# Patient Record
Sex: Female | Born: 1960 | Race: White | Hispanic: No | Smoking: Never smoker
Health system: Southern US, Community
[De-identification: ages and names within clinical notes are randomized; demographics above are authoritative.]

## PROBLEM LIST (undated history)

## (undated) DIAGNOSIS — R519 Headache, unspecified: Secondary | ICD-10-CM

## (undated) DIAGNOSIS — F419 Anxiety disorder, unspecified: Secondary | ICD-10-CM

## (undated) DIAGNOSIS — R51 Headache: Secondary | ICD-10-CM

## (undated) HISTORY — PX: WISDOM TOOTH EXTRACTION: SHX21

---

## 2004-02-24 HISTORY — PX: BREAST BIOPSY: SHX20

## 2009-02-23 HISTORY — PX: WOUND DEBRIDEMENT: SHX247

## 2011-07-30 ENCOUNTER — Other Ambulatory Visit: Payer: Self-pay | Admitting: Obstetrics and Gynecology

## 2011-07-30 DIAGNOSIS — Z1231 Encounter for screening mammogram for malignant neoplasm of breast: Secondary | ICD-10-CM

## 2011-08-10 ENCOUNTER — Ambulatory Visit
Admission: RE | Admit: 2011-08-10 | Discharge: 2011-08-10 | Disposition: A | Discharge status: Home / Self Care | Payer: 59 | Source: Ambulatory Visit | Attending: Obstetrics and Gynecology | Admitting: Obstetrics and Gynecology

## 2011-08-10 DIAGNOSIS — Z1231 Encounter for screening mammogram for malignant neoplasm of breast: Secondary | ICD-10-CM

## 2014-05-16 ENCOUNTER — Other Ambulatory Visit (HOSPITAL_COMMUNITY)
Admission: RE | Admit: 2014-05-16 | Discharge: 2014-05-16 | Disposition: A | Discharge status: Home / Self Care | Payer: 59 | Source: Ambulatory Visit | Attending: Family Medicine | Admitting: Family Medicine

## 2014-05-16 ENCOUNTER — Other Ambulatory Visit: Payer: Self-pay | Admitting: Family Medicine

## 2014-05-16 DIAGNOSIS — Z124 Encounter for screening for malignant neoplasm of cervix: Secondary | ICD-10-CM | POA: Insufficient documentation

## 2014-05-16 DIAGNOSIS — Z1151 Encounter for screening for human papillomavirus (HPV): Secondary | ICD-10-CM | POA: Diagnosis present

## 2014-05-16 DIAGNOSIS — Z113 Encounter for screening for infections with a predominantly sexual mode of transmission: Secondary | ICD-10-CM | POA: Insufficient documentation

## 2014-05-17 LAB — CYTOLOGY - PAP

## 2014-05-28 ENCOUNTER — Other Ambulatory Visit: Payer: Self-pay

## 2014-05-28 DIAGNOSIS — Z1231 Encounter for screening mammogram for malignant neoplasm of breast: Secondary | ICD-10-CM

## 2014-06-06 ENCOUNTER — Ambulatory Visit: Payer: 59

## 2014-06-07 ENCOUNTER — Ambulatory Visit
Admission: RE | Admit: 2014-06-07 | Discharge: 2014-06-07 | Disposition: A | Discharge status: Home / Self Care | Payer: 59 | Source: Ambulatory Visit

## 2014-06-07 DIAGNOSIS — Z1231 Encounter for screening mammogram for malignant neoplasm of breast: Secondary | ICD-10-CM

## 2014-06-11 ENCOUNTER — Ambulatory Visit: Payer: 59 | Attending: Gynecologic Oncology | Admitting: Gynecologic Oncology

## 2014-06-11 ENCOUNTER — Encounter: Payer: Self-pay | Admitting: Gynecologic Oncology

## 2014-06-11 VITALS — BP 153/89 | HR 97 | Temp 98.8°F | Resp 18 | Ht 63.0 in | Wt 187.9 lb

## 2014-06-11 DIAGNOSIS — C541 Malignant neoplasm of endometrium: Secondary | ICD-10-CM | POA: Insufficient documentation

## 2014-06-11 DIAGNOSIS — C801 Malignant (primary) neoplasm, unspecified: Secondary | ICD-10-CM

## 2014-06-11 DIAGNOSIS — N888 Other specified noninflammatory disorders of cervix uteri: Secondary | ICD-10-CM

## 2014-06-11 NOTE — Patient Instructions (Signed)
Dr. Denman George will call you with the results of your biopsies.                Preparing for your Surgery  Plan for surgery on June 7 with Dr. Denman George.  Pre-operative Testing -You will receive a phone call from presurgical testing at [facility] to arrange for a pre-operative testing appointment before your surgery.  This appointment normally occurs one to two weeks before your scheduled surgery.   -Bring your insurance card, copy of an advanced directive if applicable, medication list  -At that visit, you will be asked to sign a consent for a possible blood transfusion in case a transfusion becomes necessary during surgery.  The need for a blood transfusion is rare but having consent is a necessary part of your care.     -You should not be taking blood thinners or aspirin at least ten days prior to surgery unless instructed by your surgeon.  Day Before Surgery at [place] will be asked to take in only clear liquids the day before surgery.  Examples of clear liquids include broths, jello, and clear juices.  You will be advised to have nothing to eat or drink after midnight the evening before.    Your role in recovery Your role is to become active as soon as directed by your doctor, while still giving yourself time to heal.  Rest when you feel tired. You will be asked to do the following in order to speed your recovery:  - Cough and breathe deeply. This helps toclear and expand your lungs and can prevent pneumonia. You may be given a spirometer to practice deep breathing. A staff member will show you how to use the spirometer. - Do mild physical activity. Walking or moving your legs help your circulation and body functions return to normal. A staff member will help you when you try to walk and will provide you with simple exercises. Do not try to get up or walk alone the first time. - Actively manage your pain. Managing your pain lets you move in comfort. We will ask you to rate your pain on  a scale of zero to 10. It is your responsibility to tell your doctor or nurse where and how much you hurt so your pain can be treated.  Special Considerations -If you are diabetic, you may be placed on insulin after surgery to have closer control over your blood sugars to promote healing and recovery.  This does not mean that you will be discharged on insulin.  If applicable, your oral antidiabetics will be resumed when you are tolerating a solid diet.  -Your final pathology results from surgery should be available by the Friday after surgery and the results will be relayed to you when available.  Blood Transfusion Information WHAT IS A BLOOD TRANSFUSION? A transfusion is the replacement of blood or some of its parts. Blood is made up of multiple cells which provide different functions.  Red blood cells carry oxygen and are used for blood loss replacement.  White blood cells fight against infection.  Platelets control bleeding.  Plasma helps clot blood.  Other blood products are available for specialized needs, such as hemophilia or other clotting disorders. BEFORE THE TRANSFUSION  Who gives blood for transfusions?   You may be able to donate blood to be used at a later date on yourself (autologous donation).  Relatives can be asked to donate blood. This is generally not any safer than if you  have received blood from a stranger. The same precautions are taken to ensure safety when a relative's blood is donated.  Healthy volunteers who are fully evaluated to make sure their blood is safe. This is blood bank blood. Transfusion therapy is the safest it has ever been in the practice of medicine. Before blood is taken from a donor, a complete history is taken to make sure that person has no history of diseases nor engages in risky social behavior (examples are intravenous drug use or sexual activity with multiple partners). The donor's travel history is screened to minimize risk of  transmitting infections, such as malaria. The donated blood is tested for signs of infectious diseases, such as HIV and hepatitis. The blood is then tested to be sure it is compatible with you in order to minimize the chance of a transfusion reaction. If you or a relative donates blood, this is often done in anticipation of surgery and is not appropriate for emergency situations. It takes many days to process the donated blood. RISKS AND COMPLICATIONS Although transfusion therapy is very safe and saves many lives, the main dangers of transfusion include:   Getting an infectious disease.  Developing a transfusion reaction. This is an allergic reaction to something in the blood you were given. Every precaution is taken to prevent this. The decision to have a blood transfusion has been considered carefully by your caregiver before blood is given. Blood is not given unless the benefits outweigh the risks.

## 2014-06-11 NOTE — Progress Notes (Signed)
Consult Note: Gyn-Onc  Consult was requested by Dr. Landry Mellow for the evaluation of Ana Luna 54 y.o. female with an abnormal Pap smear showing adenocarcinoma and adenocarcinoma on endometrial biopsy.  CC:  Chief Complaint  Patient presents with  . adenocarcinoma    endocervical vs endometrial    Assessment/Plan:  Ana Luna  is a 54 y.o.  year old with likely clinical stage I endometrial cancer. There is a diagnostic question regarding whether this is a primary cervical adenocarcinoma or endometrial cancer. The patient has no history of abnormal bleeding or postmenopausal bleeding. However she also has no history of abnormal Pap smears or HPV positivity. Her HPV high risk was negative on this recent Pap.  I discussed the importance of determining (to the best of our abilities) preoperatively whether or not this lesion is a cervical or endometrial source because it would dictate a different surgical approach and to minimize the need for postoperative radiation for inadequate margins around a cervical cancer.  Today's colposcopy did not reveal obvious or apparent cervical cancer. However an endocervical lesion still may be the source. Therefore I have ordered an MRI of the pelvis to better evaluate the lower uterine segment and endocervix. If a mass is present in this region, she may benefit from a radical hysterectomy rather than a type I extrafascial hysterectomy in order to ensure negative margins and to minimize the necessity of postoperative radiation.  If today's colposcopic biopsies are negative for malignancy, given that she has a Pap smear favoring endometrial origin adenocarcinoma, and an endometrial biopsy suspicious for adenocarcinoma I believe that proceeding with a hysterectomy and staging for presumed endometrial cancer is appropriate unless an obvious cervical mass is identified on the MRI.   I discussed surgical management of endometrial cancer including my recommendation for  a robotic-assisted total hysterectomy BSO and lymph node biopsy. I discussed that risks are associated with surgery including bleeding, infection to pelvic structures of the skin, damage to internal organs, reoperation, lymphedema, nerve injury, injuries associated with positioning, reoperation, VTE. We discussed anticipated postoperative recovery and hospital stay.  If today's biopsies reveal a dysplastic process or is concerning for microinvasive cervical cancer I am recommending proceeding first with a conization of the cervix to better delineate the size in nature of the cervical lesion. Certainly endometrioid lesions can occur in primary cervical cancer and her symptoms of post-coital bleeding and discharge are more concerning for a primary cervical process.  HPI: Ana Luna is a very pleasant and very healthy 54 year old G3 P2 012 who is seen in consultation at the request of Dr. 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 Ana Luna has no coexisting medical complaints and no significant past family history for cancer. She was undergoing routine evaluation at [facility] with her Pap smear in March, 2016. Of note the patient has been compliant with our Pap smear surveillance in previous years. Her pap from 05/16/2014 showed adenocarcinoma and when reviewed by Dr. Lyndon Code from pathology was felt to favor an endometrioid origin. High-risk HPV was not detected in the sample. The patient was then seen by OB/GYN Dr. Christophe Louis who performed endometrial sampling with a pippelle in the office. This was performed on 05/29/2014. This biopsy was suspicious for adenocarcinoma with fragments of in active endometrium and microscopic detached atypical glandular fragments which is suspicious for adenocarcinoma.  The patient reports that she last went through menopause approximately 1 year ago. She's had no vaginal bleeding since that time. She had  does however reports some postcoital pink  spotting. She does also report some increased watery discharge. Both of these symptoms are somewhat concerning for potentially occult cervical cancer. She denies lower stream he edema, back pain, or pelvic pain.   Interval History: She has no vaginal bleeding  Current Meds:  Outpatient Encounter Prescriptions as of 06/11/2014  Medication Sig  . acyclovir ointment (ZOVIRAX) 5 % Apply thin layer to affected area  . pramoxine-hydrocortisone (PROCTOCREAM-HC) 1-1 % rectal cream Place rectally.  . valACYclovir (VALTREX) 500 MG tablet Take 500 mg by mouth.    Allergy: Not on File  Social Hx:   History   Social History  . Marital Status: Divorced    Spouse Name: N/A  . Number of Children: N/A  . Years of Education: N/A   Occupational History  . Not on file.   Social History Main Topics  . Smoking status: Never Smoker   . Smokeless tobacco: Not on file  . Alcohol Use: Yes     Comment: social, occasional   . Drug Use: No  . Sexual Activity: Not on file   Other Topics Concern  . Not on file   Social History Narrative  . No narrative on file    Past Surgical Hx:  Past Surgical History  Procedure Laterality Date  . Wound debridement Bilateral 2011    buttock wound cleaning from spider bite  . Breast biopsy Left 2006    left breast biopsy; benign finding    Past Medical Hx: History reviewed. No pertinent past medical history.  Past Gynecological History:  No abn paps, SVD x 2  No LMP recorded.  Family Hx:  Family History  Problem Relation Age of Onset  . Diabetes Mother   . Diabetes Father   . Heart disease Father     Review of Systems:  Constitutional  Feels well,    ENT Normal appearing ears and nares bilaterally Skin/Breast  No rash, sores, jaundice, itching, dryness Cardiovascular  No chest pain, shortness of breath, or edema  Pulmonary  No cough or wheeze.  Gastro Intestinal  No nausea, vomitting, or diarrhoea. No bright red blood per rectum, no  abdominal pain, change in bowel movement, or constipation.  Genito Urinary  No frequency, urgency, dysuria,  Musculo Skeletal  No myalgia, arthralgia, joint swelling or pain  Neurologic  No weakness, numbness, change in gait,  Psychology  No depression, anxiety, insomnia.   Vitals:  Blood pressure 153/89, pulse 97, temperature 98.8 F (37.1 C), temperature source Oral, resp. rate 18, height 5\' 3"  (1.6 m), weight 187 lb 14.4 oz (85.231 kg).  Physical Exam: WD in NAD Neck  Supple NROM, without any enlargements.  Lymph Node Survey No cervical supraclavicular or inguinal adenopathy Cardiovascular  Pulse normal rate, regularity and rhythm. S1 and S2 normal.  Lungs  Clear to auscultation bilateraly, without wheezes/crackles/rhonchi. Good air movement.  Skin  No rash/lesions/breakdown  Psychiatry  Alert and oriented to person, place, and time  Abdomen  Normoactive bowel sounds, abdomen soft, non-tender and overweight without evidence of hernia.  Back No CVA tenderness Genito Urinary  Vulva/vagina: Normal external female genitalia.   No lesions. No discharge or bleeding.  Bladder/urethra:  No lesions or masses, well supported bladder  Vagina: normal, no lesiosn  Cervix: grossly normal appearing, no lesions. Firm anteriorally - likely submucosal nabothian cyst.   Uterus:  Small, mobile, no parametrial involvement or nodularity.  Adnexa: no palpable masses. Rectal  Good tone, no masses no cul  de sac nodularity.  Extremities  No bilateral cyanosis, clubbing or edema.  PROCEDURE: COLPOSCOPY Speculum was placed in the vagina and the cervix was visualized circumferentially. It was doused with 4% acetic acid. The colposcope was used to magnify field. No acetowhite lesions or abnormal last culture was identified. The transformation what his own was identified at the cervical is in its entirety. It was grossly normal. There is prominence of the anterior lip of the cervix, which is likely  from an underlying nabothian cysts however given this abnormality and biopsy was taken from the cervix at 12:00 at the mid ectocervix. An additional endocervical curetting was performed and a biopsy from 12:00 at the transformation zone. Silver nitrate was used for hemostasis.   Donaciano Eva, MD   06/11/2014, 4:07 PM

## 2014-06-18 ENCOUNTER — Ambulatory Visit (HOSPITAL_COMMUNITY)
Admission: RE | Admit: 2014-06-18 | Discharge: 2014-06-18 | Disposition: A | Discharge status: Home / Self Care | Payer: 59 | Source: Ambulatory Visit | Attending: Gynecologic Oncology | Admitting: Gynecologic Oncology

## 2014-06-18 ENCOUNTER — Ambulatory Visit (HOSPITAL_COMMUNITY): Admission: RE | Admit: 2014-06-18 | Payer: 59 | Source: Ambulatory Visit

## 2014-06-18 DIAGNOSIS — C801 Malignant (primary) neoplasm, unspecified: Secondary | ICD-10-CM | POA: Insufficient documentation

## 2014-06-18 MED ORDER — GADOBENATE DIMEGLUMINE 529 MG/ML IV SOLN
20.0000 mL | Freq: Once | INTRAVENOUS | Status: AC | PRN
  Administered 2014-06-18: 17 mL via INTRAVENOUS

## 2014-06-19 ENCOUNTER — Telehealth: Payer: Self-pay | Admitting: Gynecologic Oncology

## 2014-06-19 NOTE — Telephone Encounter (Signed)
Informed patient of results of negative cervical biopsies and MRI showing no lower uterine segment or endocervical mass. This is likely a primary endometrial cancer. Will proceed with robotic hysterectomy, bso, lymphadenectomy.  Donaciano Eva, MD

## 2014-07-25 NOTE — Patient Instructions (Addendum)
Claire Dolores  07/25/2014   Your procedure is scheduled on:   07/31/2014    Report to [facility] Main  Entrance and follow signs to               [place] at     [place] AM.  Call this number if you have problems the morning of surgery [phone]   Remember: ONLY 1 PERSON MAY GO WITH YOU TO SHORT STAY TO GET  READY MORNING OF [place].  Clear liquid diet beginning on Monday, 07/30/2014 am.  Then nothing after midnite nite before surgery.     Take these medicines the morning of surgery with A SIP OF WATER: None                               You may not have any metal on your body including hair pins and              piercings  Do not wear jewelry, make-up, lotions, powders or perfumes, deodorant             Do not wear nail polish.  Do not shave  48 hours prior to surgery.               Do not bring valuables to the hospital. [place].  Contacts, dentures or bridgework may not be worn into surgery.  Leave suitcase in the car. After surgery it may be brought to your room.         Special Instructions: coughing and deep breathing exercises, leg exercises               Please read over the following fact sheets you were given: _____________________________________________________________________             [facility] - Preparing for Surgery Before surgery, you can play an important role.  Because skin is not sterile, your skin needs to be as free of germs as possible.  You can reduce the number of germs on your skin by washing with CHG (chlorahexidine gluconate) soap before surgery.  CHG is an antiseptic cleaner which kills germs and bonds with the skin to continue killing germs even after washing. Please DO NOT use if you have an allergy to CHG or antibacterial soaps.  If your skin becomes reddened/irritated stop using the CHG and inform your nurse when you arrive at Short Stay. Do not shave (including  legs and underarms) for at least 48 hours prior to the first CHG shower.  You may shave your face/neck. Please follow these instructions carefully:  1.  Shower with CHG Soap the night before surgery and the  morning of Surgery.  2.  If you choose to wash your hair, wash your hair first as usual with your  normal  shampoo.  3.  After you shampoo, rinse your hair and body thoroughly to remove the  shampoo.                           4.  Use CHG as you would any other liquid soap.  You can apply chg directly  to the skin and wash  Gently with a scrungie or clean washcloth.  5.  Apply the CHG Soap to your body ONLY FROM THE NECK DOWN.   Do not use on face/ open                           Wound or open sores. Avoid contact with eyes, ears mouth and genitals (private parts).                       Wash face,  Genitals (private parts) with your normal soap.             6.  Wash thoroughly, paying special attention to the area where your surgery  will be performed.  7.  Thoroughly rinse your body with warm water from the neck down.  8.  DO NOT shower/wash with your normal soap after using and rinsing off  the CHG Soap.                9.  Pat yourself dry with a clean towel.            10.  Wear clean pajamas.            11.  Place clean sheets on your bed the night of your first shower and do not  sleep with pets. Day of Surgery : Do not apply any lotions/deodorants the morning of surgery.  Please wear clean clothes to the hospital/surgery center.  FAILURE TO FOLLOW THESE INSTRUCTIONS MAY RESULT IN THE CANCELLATION OF YOUR SURGERY PATIENT SIGNATURE_________________________________  NURSE SIGNATURE__________________________________  ________________________________________________________________________    CLEAR LIQUID DIET   Foods Allowed                                                                     Foods Excluded  Coffee and tea, regular and decaf                              liquids that you cannot  Plain Jell-O in any flavor                                             see through such as: Fruit ices (not with fruit pulp)                                     milk, soups, orange juice  Iced Popsicles                                    All solid food Carbonated beverages, regular and diet                                    Cranberry, grape and apple juices Sports drinks like Gatorade Lightly seasoned clear broth or consume(fat free) Sugar, honey syrup  Sample Menu Breakfast                                Lunch                                     Supper Cranberry juice                    Beef broth                            Chicken broth Jell-O                                     Grape juice                           Apple juice Coffee or tea                        Jell-O                                      Popsicle                                                Coffee or tea                        Coffee or tea  _____________________________________________________________________   WHAT IS A BLOOD TRANSFUSION? Blood Transfusion Information  A transfusion is the replacement of blood or some of its parts. Blood is made up of multiple cells which provide different functions.  Red blood cells carry oxygen and are used for blood loss replacement.  White blood cells fight against infection.  Platelets control bleeding.  Plasma helps clot blood.  Other blood products are available for specialized needs, such as hemophilia or other clotting disorders. BEFORE THE TRANSFUSION  Who gives blood for transfusions?   Healthy volunteers who are fully evaluated to make sure their blood is safe. This is blood bank blood. Transfusion therapy is the safest it has ever been in the practice of medicine. Before blood is taken from a donor, a complete history is taken to make sure that person has no history of diseases nor engages in risky social behavior (examples  are intravenous drug use or sexual activity with multiple partners). The donor's travel history is screened to minimize risk of transmitting infections, such as malaria. The donated blood is tested for signs of infectious diseases, such as HIV and hepatitis. The blood is then tested to be sure it is compatible with you in order to minimize the chance of a transfusion reaction. If you or a relative donates blood, this is often done in anticipation of surgery and is not appropriate for emergency situations. It takes many days to process the donated blood. RISKS AND COMPLICATIONS Although transfusion therapy is very safe and saves many lives, the main dangers of transfusion include:  1. Getting an infectious disease. 2. Developing a transfusion reaction. This is an allergic reaction to something in the blood you were given. Every precaution is taken to prevent this. The decision to have a blood transfusion has been considered carefully by your caregiver before blood is given. Blood is not given unless the benefits outweigh the risks. AFTER THE TRANSFUSION  Right after receiving a blood transfusion, you will usually feel much better and more energetic. This is especially true if your red blood cells have gotten low (anemic). The transfusion raises the level of the red blood cells which carry oxygen, and this usually causes an energy increase.  The nurse administering the transfusion will monitor you carefully for complications. HOME CARE INSTRUCTIONS  No special instructions are needed after a transfusion. You may find your energy is better. Speak with your caregiver about any limitations on activity for underlying diseases you may have. SEEK MEDICAL CARE IF:   Your condition is not improving after your transfusion.  You develop redness or irritation at the intravenous (IV) site. SEEK IMMEDIATE MEDICAL CARE IF:  Any of the following symptoms occur over the next 12 hours:  Shaking chills.  You have  a temperature by mouth above 102 F (38.9 C), not controlled by medicine.  Chest, back, or muscle pain.  People around you feel you are not acting correctly or are confused.  Shortness of breath or difficulty breathing.  Dizziness and fainting.  You get a rash or develop hives.  You have a decrease in urine output.  Your urine turns a dark color or changes to pink, red, or brown. Any of the following symptoms occur over the next 10 days:  You have a temperature by mouth above 102 F (38.9 C), not controlled by medicine.  Shortness of breath.  Weakness after normal activity.  The white part of the eye turns yellow (jaundice).  You have a decrease in the amount of urine or are urinating less often.  Your urine turns a dark color or changes to pink, red, or brown. Document Released: 02/07/2000 Document Revised: 05/04/2011 Document Reviewed: 09/26/2007 ExitCare Patient Information 2014 [place].  _______________________________________________________________________  Incentive Spirometer  An incentive spirometer is a tool that can help keep your lungs clear and active. This tool measures how well you are filling your lungs with each breath. Taking long deep breaths may help reverse or decrease the chance of developing breathing (pulmonary) problems (especially infection) following:  A long period of time when you are unable to move or be active. BEFORE THE PROCEDURE   If the spirometer includes an indicator to show your best effort, your nurse or respiratory therapist will set it to a desired goal.  If possible, sit up straight or lean slightly forward. Try not to slouch.  Hold the incentive spirometer in an upright position. INSTRUCTIONS FOR USE  3. Sit on the edge of your bed if possible, or sit up as far as you can in bed or on a chair. 4. Hold the incentive spirometer in an upright position. 5. Breathe out normally. 6. Place the mouthpiece in your mouth and  seal your lips tightly around it. 7. Breathe in slowly and as deeply as possible, raising the piston or the ball toward the top of the column. 8. Hold your breath for 3-5 seconds or for as long as possible. Allow the piston or ball to fall to the bottom of the column. 9. Remove the mouthpiece from your mouth and breathe out  normally. 10. Rest for a few seconds and repeat Steps 1 through 7 at least 10 times every 1-2 hours when you are awake. Take your time and take a few normal breaths between deep breaths. 11. The spirometer may include an indicator to show your best effort. Use the indicator as a goal to work toward during each repetition. 12. After each set of 10 deep breaths, practice coughing to be sure your lungs are clear. If you have an incision (the cut made at the time of surgery), support your incision when coughing by placing a pillow or rolled up towels firmly against it. Once you are able to get out of bed, walk around indoors and cough well. You may stop using the incentive spirometer when instructed by your caregiver.  RISKS AND COMPLICATIONS  Take your time so you do not get dizzy or light-headed.  If you are in pain, you may need to take or ask for pain medication before doing incentive spirometry. It is harder to take a deep breath if you are having pain. AFTER USE  Rest and breathe slowly and easily.  It can be helpful to keep track of a log of your progress. Your caregiver can provide you with a simple table to help with this. If you are using the spirometer at home, follow these instructions: [place] IF:   You are having difficultly using the spirometer.  You have trouble using the spirometer as often as instructed.  Your pain medication is not giving enough relief while using the spirometer.  You develop fever of 100.5 F (38.1 C) or higher. SEEK IMMEDIATE MEDICAL CARE IF:   You cough up bloody sputum that had not been present before.  You develop  fever of 102 F (38.9 C) or greater.  You develop worsening pain at or near the incision site. MAKE SURE YOU:   Understand these instructions.  Will watch your condition.  Will get help right away if you are not doing well or get worse. Document Released: 06/22/2006 Document Revised: 05/04/2011 Document Reviewed: 08/23/2006 [facility] Patient Information 2014 [place], [place].   ________________________________________________________________________

## 2014-07-27 ENCOUNTER — Encounter (HOSPITAL_COMMUNITY): Payer: Self-pay

## 2014-07-27 ENCOUNTER — Ambulatory Visit (HOSPITAL_COMMUNITY)
Admission: RE | Admit: 2014-07-27 | Discharge: 2014-07-27 | Disposition: A | Discharge status: Home / Self Care | Payer: 59 | Source: Ambulatory Visit | Attending: Gynecologic Oncology | Admitting: Gynecologic Oncology

## 2014-07-27 ENCOUNTER — Encounter (HOSPITAL_COMMUNITY)
Admission: RE | Admit: 2014-07-27 | Discharge: 2014-07-27 | Disposition: A | Discharge status: Home / Self Care | Payer: 59 | Source: Ambulatory Visit | Attending: Gynecologic Oncology | Admitting: Gynecologic Oncology

## 2014-07-27 DIAGNOSIS — C801 Malignant (primary) neoplasm, unspecified: Secondary | ICD-10-CM

## 2014-07-27 DIAGNOSIS — Z01818 Encounter for other preprocedural examination: Secondary | ICD-10-CM | POA: Insufficient documentation

## 2014-07-27 HISTORY — DX: Anxiety disorder, unspecified: F41.9

## 2014-07-27 HISTORY — DX: Headache: R51

## 2014-07-27 HISTORY — DX: Headache, unspecified: R51.9

## 2014-07-27 LAB — CBC WITH DIFFERENTIAL/PLATELET
BASOS ABS: 0 10*3/uL (ref 0.0–0.1)
Basophils Relative: 0 % (ref 0–1)
Eosinophils Absolute: 0.1 10*3/uL (ref 0.0–0.7)
Eosinophils Relative: 1 % (ref 0–5)
HCT: 39.6 % (ref 36.0–46.0)
HEMOGLOBIN: 12.8 g/dL (ref 12.0–15.0)
Lymphocytes Relative: 22 % (ref 12–46)
Lymphs Abs: 1.6 10*3/uL (ref 0.7–4.0)
MCH: 27.8 pg (ref 26.0–34.0)
MCHC: 32.3 g/dL (ref 30.0–36.0)
MCV: 85.9 fL (ref 78.0–100.0)
MONO ABS: 0.5 10*3/uL (ref 0.1–1.0)
Monocytes Relative: 6 % (ref 3–12)
Neutro Abs: 5.3 10*3/uL (ref 1.7–7.7)
Neutrophils Relative %: 71 % (ref 43–77)
Platelets: 262 10*3/uL (ref 150–400)
RBC: 4.61 MIL/uL (ref 3.87–5.11)
RDW: 12.9 % (ref 11.5–15.5)
WBC: 7.5 10*3/uL (ref 4.0–10.5)

## 2014-07-27 LAB — URINALYSIS, ROUTINE W REFLEX MICROSCOPIC
BILIRUBIN URINE: NEGATIVE
Glucose, UA: NEGATIVE mg/dL
KETONES UR: NEGATIVE mg/dL
Leukocytes, UA: NEGATIVE
NITRITE: NEGATIVE
PROTEIN: NEGATIVE mg/dL
Specific Gravity, Urine: 1.008 (ref 1.005–1.030)
Urobilinogen, UA: 0.2 mg/dL (ref 0.0–1.0)
pH: 5.5 (ref 5.0–8.0)

## 2014-07-27 LAB — COMPREHENSIVE METABOLIC PANEL
ALK PHOS: 67 U/L (ref 38–126)
ALT: 48 U/L (ref 14–54)
ANION GAP: 8 (ref 5–15)
AST: 34 U/L (ref 15–41)
Albumin: 4.1 g/dL (ref 3.5–5.0)
BUN: 13 mg/dL (ref 6–20)
CALCIUM: 9.5 mg/dL (ref 8.9–10.3)
CO2: 28 mmol/L (ref 22–32)
Chloride: 104 mmol/L (ref 101–111)
Creatinine, Ser: 0.67 mg/dL (ref 0.44–1.00)
GFR calc Af Amer: >60 mL/min (ref >60)
GFR calc non Af Amer: >60 mL/min (ref >60)
Glucose, Bld: 104 mg/dL — ABNORMAL HIGH (ref 65–99)
Potassium: 4.1 mmol/L (ref 3.5–5.1)
SODIUM: 140 mmol/L (ref 135–145)
TOTAL PROTEIN: 7.5 g/dL (ref 6.5–8.1)
Total Bilirubin: 0.5 mg/dL (ref 0.3–1.2)

## 2014-07-27 LAB — URINE MICROSCOPIC-ADD ON

## 2014-07-27 LAB — ABO/RH: ABO/RH(D): A NEG

## 2014-07-27 NOTE — Progress Notes (Addendum)
At preop appt. On 07-27-14  ptatients blood pressure was 150/96. After preop interview 07-27-14, repeat blood pressure was 150/108.  Pt. Asymptomatic,  not on any medications.  Surgery scheduled for 07-31-14.  Called Dr. [name]'s office at 10:15 and talked to Teresita.  Reported blood pressures, pt. asymtomatic and no medications.  Alyse Low said she would let Dr. Zadie Rhine know and will get in touch with patient if needed.  Thamas Jaegers that patient will be calling Dr. Zadie Rhine also to report blood pressure readings. Also called Dr. Serita Grit office on 07-27-14 at 10:20 and talked to [facility].  Advised Kim  Regarding patients blood pressure readings, patient asymptomatic, and takes no medications.  Maudie Mercury said she will notify Dr. Denman George and wanted my number in case she needed to call me back.   07-27-14 after labs drawn at preop visit at 10:30, blood pressure repeated - 139/103

## 2014-07-27 NOTE — Progress Notes (Signed)
LOV 06-11-14 - Dr. Denman George EPIC

## 2014-07-27 NOTE — Progress Notes (Signed)
UA and Urine microscopic on 07-27-14 sent to PCP - Dr. Harlene Ramus via EPIC

## 2014-07-31 ENCOUNTER — Encounter (HOSPITAL_COMMUNITY)
Admission: AD | Disposition: A | Discharge status: Home / Self Care | Payer: Self-pay | Source: Ambulatory Visit | Attending: Gynecologic Oncology

## 2014-07-31 ENCOUNTER — Ambulatory Visit (HOSPITAL_COMMUNITY): Payer: 59 | Admitting: Certified Registered Nurse Anesthetist

## 2014-07-31 ENCOUNTER — Inpatient Hospital Stay (HOSPITAL_COMMUNITY)
Admission: AD | Admit: 2014-07-31 | Discharge: 2014-08-03 | DRG: 737 | Disposition: A | Discharge status: Home / Self Care | Payer: 59 | Source: Ambulatory Visit | Attending: Gynecologic Oncology | Admitting: Gynecologic Oncology

## 2014-07-31 ENCOUNTER — Encounter (HOSPITAL_COMMUNITY): Payer: Self-pay | Admitting: *Deleted

## 2014-07-31 DIAGNOSIS — C541 Malignant neoplasm of endometrium: Secondary | ICD-10-CM | POA: Diagnosis present

## 2014-07-31 DIAGNOSIS — R188 Other ascites: Secondary | ICD-10-CM | POA: Diagnosis present

## 2014-07-31 DIAGNOSIS — C786 Secondary malignant neoplasm of retroperitoneum and peritoneum: Secondary | ICD-10-CM | POA: Diagnosis present

## 2014-07-31 DIAGNOSIS — C7962 Secondary malignant neoplasm of left ovary: Secondary | ICD-10-CM

## 2014-07-31 DIAGNOSIS — C5701 Malignant neoplasm of right fallopian tube: Principal | ICD-10-CM | POA: Diagnosis present

## 2014-07-31 DIAGNOSIS — C7961 Secondary malignant neoplasm of right ovary: Secondary | ICD-10-CM

## 2014-07-31 DIAGNOSIS — C7989 Secondary malignant neoplasm of other specified sites: Secondary | ICD-10-CM

## 2014-07-31 DIAGNOSIS — N736 Female pelvic peritoneal adhesions (postinfective): Secondary | ICD-10-CM | POA: Diagnosis present

## 2014-07-31 HISTORY — PX: ROBOTIC ASSISTED TOTAL HYSTERECTOMY WITH BILATERAL SALPINGO OOPHERECTOMY: SHX6086

## 2014-07-31 LAB — TYPE AND SCREEN
ABO/RH(D): A NEG
Antibody Screen: NEGATIVE

## 2014-07-31 SURGERY — ROBOTIC ASSISTED TOTAL HYSTERECTOMY WITH BILATERAL SALPINGO OOPHORECTOMY
Anesthesia: General | Laterality: Bilateral

## 2014-07-31 MED ORDER — FENTANYL CITRATE (PF) 250 MCG/5ML IJ SOLN
INTRAMUSCULAR | Status: AC
  Filled 2014-07-31: qty 5

## 2014-07-31 MED ORDER — METOCLOPRAMIDE HCL 5 MG/ML IJ SOLN
INTRAMUSCULAR | Status: DC | PRN
  Administered 2014-07-31: 10 mg via INTRAVENOUS

## 2014-07-31 MED ORDER — GLYCOPYRROLATE 0.2 MG/ML IJ SOLN
INTRAMUSCULAR | Status: DC | PRN
  Administered 2014-07-31: 0.6 mg via INTRAVENOUS

## 2014-07-31 MED ORDER — 0.9 % SODIUM CHLORIDE (POUR BTL) OPTIME
TOPICAL | Status: DC | PRN
  Administered 2014-07-31: 1000 mL

## 2014-07-31 MED ORDER — PROPOFOL 10 MG/ML IV BOLUS
INTRAVENOUS | Status: AC
  Filled 2014-07-31: qty 20

## 2014-07-31 MED ORDER — LACTATED RINGERS IV SOLN: INTRAVENOUS | Status: DC

## 2014-07-31 MED ORDER — ACETAMINOPHEN 10 MG/ML IV SOLN
INTRAVENOUS | Status: AC
  Filled 2014-07-31: qty 100

## 2014-07-31 MED ORDER — DEXAMETHASONE SODIUM PHOSPHATE 10 MG/ML IJ SOLN
INTRAMUSCULAR | Status: AC
  Filled 2014-07-31: qty 1

## 2014-07-31 MED ORDER — LABETALOL HCL 5 MG/ML IV SOLN
INTRAVENOUS | Status: DC | PRN
  Administered 2014-07-31: 5 mg via INTRAVENOUS

## 2014-07-31 MED ORDER — BUPIVACAINE LIPOSOME 1.3 % IJ SUSP
20.0000 mL | Freq: Once | INTRAMUSCULAR | Status: AC
  Administered 2014-07-31: 20 mL
  Filled 2014-07-31: qty 20

## 2014-07-31 MED ORDER — KETOROLAC TROMETHAMINE 30 MG/ML IJ SOLN
INTRAMUSCULAR | Status: AC
  Filled 2014-07-31: qty 1

## 2014-07-31 MED ORDER — MIDAZOLAM HCL 2 MG/2ML IJ SOLN
INTRAMUSCULAR | Status: AC
  Filled 2014-07-31: qty 2

## 2014-07-31 MED ORDER — HYDROMORPHONE HCL 1 MG/ML IJ SOLN
INTRAMUSCULAR | Status: AC
  Filled 2014-07-31: qty 1

## 2014-07-31 MED ORDER — ONDANSETRON HCL 4 MG/2ML IJ SOLN
4.0000 mg | Freq: Four times a day (QID) | INTRAMUSCULAR | Status: DC | PRN
  Administered 2014-08-01: 4 mg via INTRAVENOUS
  Filled 2014-07-31 (×2): qty 2

## 2014-07-31 MED ORDER — HYDRALAZINE HCL 20 MG/ML IJ SOLN
INTRAMUSCULAR | Status: AC
  Filled 2014-07-31: qty 1

## 2014-07-31 MED ORDER — KETOROLAC TROMETHAMINE 30 MG/ML IJ SOLN
30.0000 mg | Freq: Four times a day (QID) | INTRAMUSCULAR | Status: AC
  Filled 2014-07-31: qty 1

## 2014-07-31 MED ORDER — ENOXAPARIN SODIUM 40 MG/0.4ML ~~LOC~~ SOLN
40.0000 mg | SUBCUTANEOUS | Status: AC
  Administered 2014-07-31: 40 mg via SUBCUTANEOUS
  Filled 2014-07-31: qty 0.4

## 2014-07-31 MED ORDER — NEOSTIGMINE METHYLSULFATE 10 MG/10ML IV SOLN
INTRAVENOUS | Status: DC | PRN
  Administered 2014-07-31: 5 mg via INTRAVENOUS

## 2014-07-31 MED ORDER — NEOSTIGMINE METHYLSULFATE 10 MG/10ML IV SOLN
INTRAVENOUS | Status: AC
  Filled 2014-07-31: qty 1

## 2014-07-31 MED ORDER — ONDANSETRON HCL 4 MG/2ML IJ SOLN
4.0000 mg | Freq: Once | INTRAMUSCULAR | Status: DC | PRN
Start: 2014-07-31 — End: 2014-07-31

## 2014-07-31 MED ORDER — METOCLOPRAMIDE HCL 5 MG/ML IJ SOLN
INTRAMUSCULAR | Status: AC
Start: 2014-07-31 — End: 2014-07-31
  Filled 2014-07-31: qty 2

## 2014-07-31 MED ORDER — GLYCOPYRROLATE 0.2 MG/ML IJ SOLN
INTRAMUSCULAR | Status: AC
  Filled 2014-07-31: qty 1

## 2014-07-31 MED ORDER — MEPERIDINE HCL 50 MG/ML IJ SOLN: 6.2500 mg | INTRAMUSCULAR | Status: DC | PRN

## 2014-07-31 MED ORDER — LACTATED RINGERS IV SOLN
INTRAVENOUS | Status: DC | PRN
Start: 2014-07-31 — End: 2014-07-31
  Administered 2014-07-31 (×3): via INTRAVENOUS

## 2014-07-31 MED ORDER — CEFAZOLIN SODIUM-DEXTROSE 2-3 GM-% IV SOLR
INTRAVENOUS | Status: AC
  Filled 2014-07-31: qty 50

## 2014-07-31 MED ORDER — HYDROMORPHONE HCL 1 MG/ML IJ SOLN
0.2500 mg | INTRAMUSCULAR | Status: DC | PRN
  Administered 2014-07-31 (×4): 0.5 mg via INTRAVENOUS

## 2014-07-31 MED ORDER — PROPOFOL 10 MG/ML IV BOLUS
INTRAVENOUS | Status: DC | PRN
  Administered 2014-07-31: 150 mg via INTRAVENOUS

## 2014-07-31 MED ORDER — LIDOCAINE HCL (CARDIAC) 20 MG/ML IV SOLN
INTRAVENOUS | Status: DC | PRN
  Administered 2014-07-31: 50 mg via INTRAVENOUS

## 2014-07-31 MED ORDER — ONDANSETRON HCL 4 MG PO TABS: 4.0000 mg | ORAL_TABLET | Freq: Four times a day (QID) | ORAL | Status: DC | PRN

## 2014-07-31 MED ORDER — SODIUM CHLORIDE 0.9 % IJ SOLN
INTRAMUSCULAR | Status: DC | PRN
  Administered 2014-07-31: 20 mL

## 2014-07-31 MED ORDER — ROCURONIUM BROMIDE 100 MG/10ML IV SOLN
INTRAVENOUS | Status: AC
  Filled 2014-07-31: qty 1

## 2014-07-31 MED ORDER — DIPHENHYDRAMINE HCL 50 MG/ML IJ SOLN: 12.5000 mg | Freq: Four times a day (QID) | INTRAMUSCULAR | Status: DC | PRN

## 2014-07-31 MED ORDER — LACTATED RINGERS IV SOLN
INTRAVENOUS | Status: DC | PRN
  Administered 2014-07-31: 1000 mL

## 2014-07-31 MED ORDER — HYDROMORPHONE 0.3 MG/ML IV SOLN
INTRAVENOUS | Status: DC
  Administered 2014-07-31: 1.59 mg via INTRAVENOUS
  Administered 2014-07-31: 16:00:00 via INTRAVENOUS
  Administered 2014-08-01: 1.39 mg via INTRAVENOUS
  Administered 2014-08-01: 0.4 mg via INTRAVENOUS

## 2014-07-31 MED ORDER — ONDANSETRON HCL 4 MG/2ML IJ SOLN
INTRAMUSCULAR | Status: AC
  Filled 2014-07-31: qty 2

## 2014-07-31 MED ORDER — HYDROMORPHONE HCL 1 MG/ML IJ SOLN
INTRAMUSCULAR | Status: DC | PRN
Start: 2014-07-31 — End: 2014-07-31
  Administered 2014-07-31 (×2): 1 mg via INTRAVENOUS

## 2014-07-31 MED ORDER — PHENYLEPHRINE 40 MCG/ML (10ML) SYRINGE FOR IV PUSH (FOR BLOOD PRESSURE SUPPORT)
PREFILLED_SYRINGE | INTRAVENOUS | Status: AC
  Filled 2014-07-31: qty 10

## 2014-07-31 MED ORDER — ONDANSETRON HCL 4 MG/2ML IJ SOLN
INTRAMUSCULAR | Status: DC | PRN
  Administered 2014-07-31: 4 mg via INTRAVENOUS

## 2014-07-31 MED ORDER — SODIUM CHLORIDE 0.9 % IJ SOLN: 9.0000 mL | INTRAMUSCULAR | Status: DC | PRN

## 2014-07-31 MED ORDER — LIDOCAINE HCL (CARDIAC) 20 MG/ML IV SOLN
INTRAVENOUS | Status: AC
  Filled 2014-07-31: qty 5

## 2014-07-31 MED ORDER — HYDROMORPHONE 0.3 MG/ML IV SOLN
INTRAVENOUS | Status: AC
  Filled 2014-07-31: qty 25

## 2014-07-31 MED ORDER — CEFAZOLIN SODIUM-DEXTROSE 2-3 GM-% IV SOLR
2.0000 g | INTRAVENOUS | Status: AC
  Administered 2014-07-31: 2 g via INTRAVENOUS

## 2014-07-31 MED ORDER — DEXAMETHASONE SODIUM PHOSPHATE 4 MG/ML IJ SOLN
INTRAMUSCULAR | Status: DC | PRN
  Administered 2014-07-31: 10 mg via INTRAVENOUS

## 2014-07-31 MED ORDER — NALOXONE HCL 0.4 MG/ML IJ SOLN: 0.4000 mg | INTRAMUSCULAR | Status: DC | PRN

## 2014-07-31 MED ORDER — SODIUM CHLORIDE 0.9 % IJ SOLN
INTRAMUSCULAR | Status: AC
  Filled 2014-07-31: qty 20

## 2014-07-31 MED ORDER — ACETAMINOPHEN 10 MG/ML IV SOLN
1000.0000 mg | Freq: Four times a day (QID) | INTRAVENOUS | Status: AC
Start: 2014-07-31 — End: 2014-08-01
  Administered 2014-07-31 – 2014-08-01 (×3): 1000 mg via INTRAVENOUS
  Filled 2014-07-31 (×2): qty 100

## 2014-07-31 MED ORDER — HYDROMORPHONE HCL 2 MG/ML IJ SOLN
INTRAMUSCULAR | Status: AC
  Filled 2014-07-31: qty 1

## 2014-07-31 MED ORDER — LABETALOL HCL 5 MG/ML IV SOLN
INTRAVENOUS | Status: AC
Start: 2014-07-31 — End: 2014-07-31
  Filled 2014-07-31: qty 4

## 2014-07-31 MED ORDER — ROCURONIUM BROMIDE 100 MG/10ML IV SOLN
INTRAVENOUS | Status: DC | PRN
  Administered 2014-07-31: 10 mg via INTRAVENOUS
  Administered 2014-07-31: 20 mg via INTRAVENOUS
  Administered 2014-07-31 (×2): 10 mg via INTRAVENOUS
  Administered 2014-07-31: 50 mg via INTRAVENOUS

## 2014-07-31 MED ORDER — GLYCOPYRROLATE 0.2 MG/ML IJ SOLN
INTRAMUSCULAR | Status: AC
  Filled 2014-07-31: qty 2

## 2014-07-31 MED ORDER — FENTANYL CITRATE (PF) 100 MCG/2ML IJ SOLN
INTRAMUSCULAR | Status: DC | PRN
  Administered 2014-07-31: 50 ug via INTRAVENOUS
  Administered 2014-07-31: 100 ug via INTRAVENOUS
  Administered 2014-07-31 (×2): 50 ug via INTRAVENOUS

## 2014-07-31 MED ORDER — KETOROLAC TROMETHAMINE 30 MG/ML IJ SOLN
30.0000 mg | Freq: Four times a day (QID) | INTRAMUSCULAR | Status: AC
  Administered 2014-07-31 – 2014-08-01 (×4): 30 mg via INTRAVENOUS
  Filled 2014-07-31 (×4): qty 1

## 2014-07-31 MED ORDER — ENOXAPARIN SODIUM 40 MG/0.4ML ~~LOC~~ SOLN
40.0000 mg | SUBCUTANEOUS | Status: DC
  Administered 2014-08-01 – 2014-08-03 (×3): 40 mg via SUBCUTANEOUS
  Filled 2014-07-31 (×5): qty 0.4

## 2014-07-31 MED ORDER — KCL IN DEXTROSE-NACL 20-5-0.45 MEQ/L-%-% IV SOLN
INTRAVENOUS | Status: DC
  Administered 2014-07-31: 20:00:00 via INTRAVENOUS
  Administered 2014-08-01: 1000 mL via INTRAVENOUS
  Filled 2014-07-31 (×3): qty 1000

## 2014-07-31 MED ORDER — DIPHENHYDRAMINE HCL 12.5 MG/5ML PO ELIX: 12.5000 mg | ORAL_SOLUTION | Freq: Four times a day (QID) | ORAL | Status: DC | PRN

## 2014-07-31 MED ORDER — MIDAZOLAM HCL 5 MG/5ML IJ SOLN
INTRAMUSCULAR | Status: DC | PRN
  Administered 2014-07-31: 2 mg via INTRAVENOUS

## 2014-07-31 MED ORDER — HYDRALAZINE HCL 20 MG/ML IJ SOLN
INTRAMUSCULAR | Status: DC | PRN
  Administered 2014-07-31 (×2): 2 mg via INTRAVENOUS

## 2014-07-31 SURGICAL SUPPLY — 66 items
ATTRACTOMAT 16X20 MAGNETIC DRP (DRAPES) ×2 IMPLANT
CABLE HIGH FREQUENCY MONO STRZ (ELECTRODE) ×2 IMPLANT
CHLORAPREP W/TINT 26ML (MISCELLANEOUS) ×2 IMPLANT
CLIP TI MEDIUM 6 (CLIP) ×2 IMPLANT
CLIP TI MEDIUM LARGE 6 (CLIP) ×2 IMPLANT
CORDS BIPOLAR (ELECTRODE) ×2 IMPLANT
COVER SURGICAL LIGHT HANDLE (MISCELLANEOUS) ×2 IMPLANT
COVER TIP SHEARS 8 DVNC (MISCELLANEOUS) ×1 IMPLANT
COVER TIP SHEARS 8MM DA VINCI (MISCELLANEOUS) ×1
DRAPE LG THREE QUARTER DISP (DRAPES) ×2 IMPLANT
DRAPE SHEET LG 3/4 BI-LAMINATE (DRAPES) ×4 IMPLANT
DRAPE SURG IRRIG POUCH 19X23 (DRAPES) ×2 IMPLANT
DRAPE TABLE BACK 44X90 PK DISP (DRAPES) ×4 IMPLANT
DRAPE WARM FLUID 44X44 (DRAPE) ×2 IMPLANT
DRSG OPSITE POSTOP 4X12 (GAUZE/BANDAGES/DRESSINGS) ×2 IMPLANT
DRSG TEGADERM 6X8 (GAUZE/BANDAGES/DRESSINGS) ×4 IMPLANT
ELECT BLADE 6.5 EXT (BLADE) ×2 IMPLANT
ELECT CAUTERY BLADE 6.4 (BLADE) ×2 IMPLANT
ELECT REM PT RETURN 9FT ADLT (ELECTROSURGICAL) ×2
ELECTRODE REM PT RTRN 9FT ADLT (ELECTROSURGICAL) ×1 IMPLANT
GLOVE BIO SURGEON STRL SZ 6 (GLOVE) ×6 IMPLANT
GLOVE BIO SURGEON STRL SZ 6.5 (GLOVE) ×4 IMPLANT
GOWN STRL REUS W/ TWL LRG LVL3 (GOWN DISPOSABLE) ×3 IMPLANT
GOWN STRL REUS W/TWL LRG LVL3 (GOWN DISPOSABLE) ×3
HOLDER FOLEY CATH W/STRAP (MISCELLANEOUS) ×2 IMPLANT
KIT ACCESSORY DA VINCI DISP (KITS) ×1
KIT ACCESSORY DVNC DISP (KITS) ×1 IMPLANT
KIT BASIN OR (CUSTOM PROCEDURE TRAY) ×2 IMPLANT
KIT PROCEDURE DA VINCI SI (MISCELLANEOUS) ×1
KIT PROCEDURE DVNC SI (MISCELLANEOUS) ×1 IMPLANT
LIGASURE IMPACT 36 18CM CVD LR (INSTRUMENTS) ×4 IMPLANT
LIQUID BAND (GAUZE/BANDAGES/DRESSINGS) ×2 IMPLANT
MANIPULATOR UTERINE 4.5 ZUMI (MISCELLANEOUS) ×2 IMPLANT
OCCLUDER COLPOPNEUMO (BALLOONS) ×2 IMPLANT
PEN SKIN MARKING BROAD (MISCELLANEOUS) ×2 IMPLANT
PENCIL BUTTON HOLSTER BLD 10FT (ELECTRODE) ×2 IMPLANT
POUCH SPECIMEN RETRIEVAL 10MM (ENDOMECHANICALS) IMPLANT
SET TUBE IRRIG SUCTION NO TIP (IRRIGATION / IRRIGATOR) ×2 IMPLANT
SHEET LAVH (DRAPES) ×2 IMPLANT
SOLUTION ELECTROLUBE (MISCELLANEOUS) ×2 IMPLANT
SPONGE LAP 18X18 X RAY DECT (DISPOSABLE) ×6 IMPLANT
STAPLER VISISTAT 35W (STAPLE) ×2 IMPLANT
SUT PDS AB 1 TP1 96 (SUTURE) ×4 IMPLANT
SUT SILK 3 0 SH CR/8 (SUTURE) ×2 IMPLANT
SUT VIC AB 0 CT1 27 (SUTURE) ×3
SUT VIC AB 0 CT1 27XBRD ANTBC (SUTURE) ×3 IMPLANT
SUT VIC AB 2-0 CT1 27 (SUTURE) ×2
SUT VIC AB 2-0 CT1 27XBRD (SUTURE) ×2 IMPLANT
SUT VIC AB 2-0 CT2 27 (SUTURE) ×14 IMPLANT
SUT VIC AB 3-0 SH 27 (SUTURE) ×4
SUT VIC AB 3-0 SH 27XBRD (SUTURE) ×4 IMPLANT
SUT VIC AB 4-0 PS2 27 (SUTURE) ×4 IMPLANT
SUT VICRYL 0 UR6 27IN ABS (SUTURE) ×4 IMPLANT
SYR 50ML LL SCALE MARK (SYRINGE) ×2 IMPLANT
SYR BULB IRRIGATION 50ML (SYRINGE) ×2 IMPLANT
TOWEL OR 17X26 10 PK STRL BLUE (TOWEL DISPOSABLE) ×4 IMPLANT
TOWEL OR NON WOVEN STRL DISP B (DISPOSABLE) ×2 IMPLANT
TRAP SPECIMEN MUCOUS 40CC (MISCELLANEOUS) IMPLANT
TRAY FOLEY W/METER SILVER 14FR (SET/KITS/TRAYS/PACK) ×2 IMPLANT
TRAY LAPAROSCOPIC (CUSTOM PROCEDURE TRAY) ×2 IMPLANT
TROCAR 12M 150ML BLUNT (TROCAR) ×2 IMPLANT
TROCAR BLADELESS OPT 5 100 (ENDOMECHANICALS) ×2 IMPLANT
TROCAR XCEL 12X100 BLDLESS (ENDOMECHANICALS) ×2 IMPLANT
TUBING INSUFFLATION 10FT LAP (TUBING) ×2 IMPLANT
WATER STERILE IRR 1500ML POUR (IV SOLUTION) ×4 IMPLANT
YANKAUER SUCT BULB TIP 10FT TU (MISCELLANEOUS) ×4 IMPLANT

## 2014-07-31 NOTE — Op Note (Signed)
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 Exploratory laparotomy with total abdominal hysterectomy, bilateral salpingo-oophorectomy, omentectomy radical tumor debulking for ovarian cancer .  Surgeon: Thereasa Solo, MD.  Assistant Surgeon: Lahoma Crocker, M.D. Assistant: (an MD assistant was necessary for tissue manipulation, retraction and positioning due to the complexity of the case and hospital policies).   Specimens: Bilateral tubes / ovaries, omentum.    Estimated Blood Loss: 300 mL.    Urine Output: 017BL  Complications: None.   Operative Findings: Large omental cake replacing the entire omentum from the greater curvature of the stomach to the infracolic omentum, from the hepatic flexure to the splenic hilum. 1000cc ascites. 12 week size uterus with grossly normal cervix. Ovaries and fallopian tubes replaced by tumor. <1cm nodules of cancer on the hepatic capsule.    This represented an optimal cytoreduction (R1) with <1cm3 gross visible disease remaining on the hepatic capsule and posterior cul de sac plaque.   Procedure:   The patient was seen in the Holding Room. The risks, benefits, complications, treatment options, and expected outcomes were discussed with the patient.  The patient concurred with the proposed plan, giving informed consent.   The patient was  identified as Ana Luna  and the procedure verified as robotic hysterectomy, BSO, lymph node dissection. A Time Out was held and the above information confirmed upon entry to the operating room.  After induction of anesthesia, the patient was draped and prepped in the usual sterile manner.  She was prepped and draped in the normal sterile fashion in the dorsal lithotomy position in padded Allen stirrups with good attention paid to support of the lower back and lower  extremities. Position was adjusted for appropriate support. Her arms were padded and tucked at her sides and shoulder blocks were placed with padding to ensure no compression. A Foley catheter was placed to gravity. The uterine manipulator with koh cup were placed vaginally without difficulty.  A 5 mm incision was made in the left upper quadrant 2cm below costal margin. The optiview entry technique was used to enter the abdomen under direct visualization. Upon entry into the peritoneal cavity it became apparent that there was an omental cake of tumor and large volume ascites. A decision was made to convert to laparotomy for debulking.   A midline vertical incision was made and carried through the subcutaneous tissue to the fascia. The fascial incision was made and extended superiorally. The rectus muscles were separated. The peritoneum was identified and entered. Peritoneal incision was extended longitudinally.  The abdominal cavity was entered sharply and without incident. A Bookwalter retractor was then placed. A survey of the abdomen and pelvis revealed the above findings, which were significant for A large omental cake, and tumor involving the serosa of the uterus, bilateral tubes and ovaries which were mildly enlarged. There was some tumor plaque in the posterior cul de sac and 2 hepatic nodules on the serosa.  The omental cake was extensive and was densely adherent to the transverse colon and right and left colonic flexures. It was also adherent to the stomach and splenic hilum. The omentum was carefully dissected free from the transverse colon from the hepatic flexure to the splenic flexure using sharp metzenbaum scissor dissection. The lesser sac was entered. The tumor cake was separated from the mesentery of the transverse colon. The short gastric vessels were sealed  with ligasure and the infragastric omentum was separated from the greater curvature of the stomach removing all bulky tumor. Hemostasis  was confirmed. The tail of tumor extending towards the splenic hilum was carefully separated from the spleen. The colon was closely inspected and was noted to be intact and hemostatic. Upon inspection of the greater curvature of the stomach an area of opening was appreciated on the anterior and posterior wall of the stomach which approximated 29mm. It was located at the edge of the stomach in close approximation with the short gastric vessels on the mid greater curvature. It may have represented a site where the entry trocar had passed through the stomach in an unavoidable manner with entry into the peritoneal cavity (from decreased mobility of upper abdominal viscera secondary to the hard tumor cake). These areas on the anterior and posterior stomach surface were closed with 3-0 vicryl through mucosal and muscularis layers interrupted, and 2-0 vicryl silk sutures imbricating over the vicryl in interrupted fashion.   After packing the small bowel into the upper abdomen, we performed a  salpingo-oophorectomy by entering the  pelvic sidewall just posterior to the right round ligament. The pararectal space was developed and the retroperitoneum developed up to the level of the common iliac artery.  The course of the ureter was identified with ease. The right IP was then skeletonized, and transected with the ligasure device. The ovary was separated from its peritoneal attachments with the bovie with visualization of the ureter at all times.   The sigmoid colon was dissected from its dense tumor attachments to the left ovary using sharp dissection. The left retroperitoneal peritoneum was entered parallel to the sigmoid colon attachments and the left ureter was identified in the left retroperitoneal space. Using sharp and monopolar dissection, the left tube and ovary were freed from their peritoneal adhesions to the pelvis and sigmoid colon. The IP ligament was sealed with the ligasure.   The bladder flap anterior to  the uterus was taken down with the electrosurgical device and sharp dissection. This skeletonized the uterine vessels which were then clamped, transected and suture ligated. The cardinal ligaments bilaterally were secured with straight clamps and suture, and this allowed skeletonization of the koh cup. The electrosurgical device on cut was used to create a circumferential colpotomy. It included the majority of posterior cul de sac tumor plaque.   The peritoneal cavity was irrigated and hemostasis was confirmed at all surgical sites.  Residual tumor was present at liver capsule and posterior cul de sac at <1cm3 sites comprising and optimal (R1) cytoreduction.   The fascia was reapproximated with 0 looped PDS using a total of two sutures. The subcutaneous layer was then irrigated copiously.  Exparel long acting local anesthetic was infiltrated into the subcutaneous tissues. The skin was closed with staples. The patient tolerated the procedure well.   Sponge, lap and needle counts were correct x 2.   Donaciano Eva, MD

## 2014-07-31 NOTE — H&P (Signed)
Ana Luna is a 54 y.o. female with an abnormal Pap smear showing adenocarcinoma and adenocarcinoma on endometrial biopsy.  CC:  Chief Complaint  Patient presents with  . adenocarcinoma    endocervical vs endometrial    Assessment/Plan:  Ana Luna is a 54 y.o. year old with likely clinical stage I endometrial cancer. There is a diagnostic question regarding whether this is a primary cervical adenocarcinoma or endometrial cancer. However, colposcopy and cervical biopsies were negative and MRI shows only an endometrial abnormality (no LUS, endocervical lesions). The patient has no history of abnormal bleeding or postmenopausal bleeding. However she also has no history of abnormal Pap smears or HPV positivity. Her HPV high risk was negative on this recent Pap.   Given that she has a Pap smear favoring endometrial origin adenocarcinoma, and an endometrial biopsy suspicious for adenocarcinoma I believe that proceeding with a hysterectomy and staging for presumed endometrial cancer is appropriate.  I discussed surgical management of endometrial cancer including my recommendation for a robotic-assisted total hysterectomy BSO and lymph node biopsy. I discussed that risks are associated with surgery including bleeding, infection to pelvic structures of the skin, damage to internal organs, reoperation, lymphedema, nerve injury, injuries associated with positioning, reoperation, VTE. We discussed anticipated postoperative recovery and hospital stay.  HPI: Ana Luna is a very pleasant and very healthy 54 year old G3 P2 012 who is seen in consultation at the request of Dr. 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 Ana Luna has no coexisting medical complaints and no significant past family history for cancer. She was undergoing routine evaluation at [facility] family [facility] with her Pap smear in March, 2016. Of note the patient has been compliant with our Pap smear  surveillance in previous years. Her pap from 05/16/2014 showed adenocarcinoma and when reviewed by Dr. Lyndon Luna from pathology was felt to favor an endometrioid origin. High-risk HPV was not detected in the sample. The patient was then seen by OB/GYN Dr. Christophe Luna who performed endometrial sampling with a pippelle in the office. This was performed on 05/29/2014. This biopsy was suspicious for adenocarcinoma with fragments of in active endometrium and microscopic detached atypical glandular fragments which is suspicious for adenocarcinoma.  Colposcopy of the cervix in April 2016 was unremarkable and biopsies from the cervix and endocervix were negative. MRI in April 2016 revealed no endocervical or LUS masses.  The patient reports that she last went through menopause approximately 1 year ago. She's had no vaginal bleeding since that time. She had does however reports some postcoital pink spotting. She does also report some increased watery discharge. Both of these symptoms are somewhat concerning for potentially occult cervical cancer. She denies lower stream he edema, back pain, or pelvic pain.   Interval History: She has no vaginal bleeding  Current Meds:  Outpatient Encounter Prescriptions as of 06/11/2014  Medication Sig  . acyclovir ointment (ZOVIRAX) 5 % Apply thin layer to affected area  . pramoxine-hydrocortisone (PROCTOCREAM-HC) 1-1 % rectal cream Place rectally.  . valACYclovir (VALTREX) 500 MG tablet Take 500 mg by mouth.    Allergy: Not on File  Social Hx:  History   Social History  . Marital Status: Divorced    Spouse Name: N/A  . Number of Children: N/A  . Years of Education: N/A   Occupational History  . Not on file.   Social History Main Topics  . Smoking status: Never Smoker   . Smokeless tobacco: Not on file  . Alcohol Use: Yes  Comment: social, occasional   . Drug Use: No  . Sexual Activity: Not on file    Other Topics Concern  . Not on file   Social History Narrative  . No narrative on file    Past Surgical Hx:  Past Surgical History  Procedure Laterality Date  . Wound debridement Bilateral 2011    buttock wound cleaning from spider bite  . Breast biopsy Left 2006    left breast biopsy; benign finding    Past Medical Hx: History reviewed. No pertinent past medical history.  Past Gynecological History: No abn paps, SVD x 2 No LMP recorded.  Family Hx:  Family History  Problem Relation Age of Onset  . Diabetes Mother   . Diabetes Father   . Heart disease Father     Review of Systems:  Constitutional  Feels well,  ENT Normal appearing ears and nares bilaterally Skin/Breast  No rash, sores, jaundice, itching, dryness Cardiovascular  No chest pain, shortness of breath, or edema  Pulmonary  No cough or wheeze.  Gastro Intestinal  No nausea, vomitting, or diarrhoea. No bright red blood per rectum, no abdominal pain, change in bowel movement, or constipation.  Genito Urinary  No frequency, urgency, dysuria,  Musculo Skeletal  No myalgia, arthralgia, joint swelling or pain  Neurologic  No weakness, numbness, change in gait,  Psychology  No depression, anxiety, insomnia.   Vitals: Blood pressure 153/89, pulse 97, temperature 98.8 F (37.1 C), temperature source Oral, resp. rate 18, height 5\' 3"  (1.6 m), weight 187 lb 14.4 oz (85.231 kg).  Physical Exam: WD in NAD Neck  Supple NROM, without any enlargements.  Lymph Node Survey No cervical supraclavicular or inguinal adenopathy Cardiovascular  Pulse normal rate, regularity and rhythm. S1 and S2 normal.  Lungs  Clear to auscultation bilateraly, without wheezes/crackles/rhonchi. Good air movement.  Skin  No rash/lesions/breakdown  Psychiatry  Alert and oriented to person, place, and time  Abdomen  Normoactive bowel sounds, abdomen  soft, non-tender and overweight without evidence of hernia.  Back No CVA tenderness Genito Urinary  Vulva/vagina: Normal external female genitalia. No lesions. No discharge or bleeding. Bladder/urethra: No lesions or masses, well supported bladder Vagina: normal, no lesiosn Cervix: grossly normal appearing, no lesions. Firm anteriorally - likely submucosal nabothian cyst.  Uterus: Small, mobile, no parametrial involvement or nodularity. Adnexa: no palpable masses. Rectal  Good tone, no masses no cul de sac nodularity.  Extremities  No bilateral cyanosis, clubbing or edema.  Donaciano Eva, MD

## 2014-07-31 NOTE — Transfer of Care (Signed)
Immediate Anesthesia Transfer of Care Note  Patient: Ana Luna  Procedure(s) Performed: Procedure(s): EXPLORATORY LAPAROTOMY, OMENTECTOMY, TUMOR DEBULKING,TOTAL HYSTERECTOMY WITH BILATERAL SALPINGO OOPHORECTOMY (Bilateral)  Patient Location: PACU  Anesthesia Type:General  Level of Consciousness:  sedated, patient cooperative and responds to stimulation  Airway & Oxygen Therapy:Patient Spontanous Breathing and Patient connected to face mask oxgen  Post-op Assessment:  Report given to PACU RN and Post -op Vital signs reviewed and stable  Post vital signs:  Reviewed and stable  Last Vitals:  Filed Vitals:   07/31/14 0828  BP:   Pulse:   Temp: 37.5 C  Resp:     Complications: No apparent anesthesia complications

## 2014-07-31 NOTE — Anesthesia Procedure Notes (Signed)
Procedure Name: Intubation Date/Time: 07/31/2014 11:42 AM Performed by: Deliah Boston Pre-anesthesia Checklist: Patient identified, Emergency Drugs available, Suction available and Patient being monitored Patient Re-evaluated:Patient Re-evaluated prior to inductionOxygen Delivery Method: Circle System Utilized Preoxygenation: Pre-oxygenation with 100% oxygen Intubation Type: IV induction Ventilation: Mask ventilation without difficulty Laryngoscope Size: Mac and 3 Grade View: Grade I Tube type: Oral Number of attempts: 1 Airway Equipment and Method: Stylet and Oral airway Placement Confirmation: ETT inserted through vocal cords under direct vision,  positive ETCO2 and breath sounds checked- equal and bilateral Tube secured with: Tape Dental Injury: Teeth and Oropharynx as per pre-operative assessment

## 2014-07-31 NOTE — Anesthesia Preprocedure Evaluation (Signed)
Anesthesia Evaluation  Patient identified by MRN, date of birth, ID band Patient awake    Reviewed: Allergy & Precautions, NPO status , Patient's Chart, lab work & pertinent test results  Airway Mallampati: I  TM Distance: >3 FB Neck ROM: Full    Dental   Pulmonary    Pulmonary exam normal        Cardiovascular Normal cardiovascular exam     Neuro/Psych    GI/Hepatic   Endo/Other    Renal/GU      Musculoskeletal   Abdominal   Peds  Hematology   Anesthesia Other Findings   Reproductive/Obstetrics                             Anesthesia Physical Anesthesia Plan  ASA: II  Anesthesia Plan: General   Post-op Pain Management:    Induction: Intravenous  Airway Management Planned: Oral ETT  Additional Equipment:   Intra-op Plan:   Post-operative Plan: Extubation in OR  Informed Consent: I have reviewed the patients History and Physical, chart, labs and discussed the procedure including the risks, benefits and alternatives for the proposed anesthesia with the patient or authorized representative who has indicated his/her understanding and acceptance.     Plan Discussed with: CRNA and Surgeon  Anesthesia Plan Comments:         Anesthesia Quick Evaluation  

## 2014-07-31 NOTE — Anesthesia Postprocedure Evaluation (Signed)
Anesthesia Post Note  Patient: Ana Luna  Procedure(s) Performed: Procedure(s) (LRB): EXPLORATORY LAPAROTOMY, OMENTECTOMY, TUMOR DEBULKING,TOTAL HYSTERECTOMY WITH BILATERAL SALPINGO OOPHORECTOMY (Bilateral)  Anesthesia type: general  Patient location: PACU  Post pain: Pain level controlled  Post assessment: Patient's Cardiovascular Status Stable  Last Vitals:  Filed Vitals:   07/31/14 1800  BP: 139/86  Pulse: 110  Temp: 36.8 C  Resp: 14    Post vital signs: Reviewed and stable  Level of consciousness: sedated  Complications: No apparent anesthesia complications

## 2014-08-01 ENCOUNTER — Encounter (HOSPITAL_COMMUNITY): Payer: Self-pay | Admitting: Gynecologic Oncology

## 2014-08-01 LAB — BASIC METABOLIC PANEL
Anion gap: 7 (ref 5–15)
BUN: 11 mg/dL (ref 6–20)
CALCIUM: 8.1 mg/dL — AB (ref 8.9–10.3)
CO2: 29 mmol/L (ref 22–32)
CREATININE: 0.77 mg/dL (ref 0.44–1.00)
Chloride: 102 mmol/L (ref 101–111)
GFR calc Af Amer: >60 mL/min (ref >60)
GFR calc non Af Amer: >60 mL/min (ref >60)
GLUCOSE: 191 mg/dL — AB (ref 65–99)
Potassium: 4.8 mmol/L (ref 3.5–5.1)
Sodium: 138 mmol/L (ref 135–145)

## 2014-08-01 LAB — CBC
HEMATOCRIT: 36 % (ref 36.0–46.0)
Hemoglobin: 11.3 g/dL — ABNORMAL LOW (ref 12.0–15.0)
MCH: 27.5 pg (ref 26.0–34.0)
MCHC: 31.4 g/dL (ref 30.0–36.0)
MCV: 87.6 fL (ref 78.0–100.0)
Platelets: 291 10*3/uL (ref 150–400)
RBC: 4.11 MIL/uL (ref 3.87–5.11)
RDW: 13.2 % (ref 11.5–15.5)
WBC: 13.6 10*3/uL — ABNORMAL HIGH (ref 4.0–10.5)

## 2014-08-01 MED ORDER — KCL IN DEXTROSE-NACL 20-5-0.45 MEQ/L-%-% IV SOLN
INTRAVENOUS | Status: DC
  Administered 2014-08-01 – 2014-08-02 (×2): via INTRAVENOUS
  Filled 2014-08-01 (×3): qty 1000

## 2014-08-01 MED ORDER — OXYCODONE HCL 5 MG PO TABS
5.0000 mg | ORAL_TABLET | ORAL | Status: DC | PRN
  Administered 2014-08-02 – 2014-08-03 (×4): 5 mg via ORAL
  Filled 2014-08-01 (×5): qty 1

## 2014-08-01 MED ORDER — HYDROMORPHONE HCL 1 MG/ML IJ SOLN
0.5000 mg | INTRAMUSCULAR | Status: DC | PRN
  Administered 2014-08-01 – 2014-08-02 (×3): 1 mg via INTRAVENOUS
  Filled 2014-08-01 (×2): qty 1

## 2014-08-01 MED ORDER — HYDROMORPHONE HCL 1 MG/ML IJ SOLN
INTRAMUSCULAR | Status: AC
  Filled 2014-08-01: qty 1

## 2014-08-01 MED ORDER — PROCHLORPERAZINE 25 MG RE SUPP
25.0000 mg | Freq: Two times a day (BID) | RECTAL | Status: DC | PRN
  Administered 2014-08-01: 25 mg via RECTAL
  Filled 2014-08-01 (×2): qty 1

## 2014-08-01 MED ORDER — TRAMADOL HCL 50 MG PO TABS
50.0000 mg | ORAL_TABLET | Freq: Four times a day (QID) | ORAL | Status: DC | PRN
  Administered 2014-08-02 (×2): 50 mg via ORAL
  Filled 2014-08-01 (×2): qty 1

## 2014-08-01 MED ORDER — ENOXAPARIN (LOVENOX) PATIENT EDUCATION KIT
PACK | Freq: Once | Status: AC
  Administered 2014-08-02: 08:00:00
  Filled 2014-08-01: qty 1

## 2014-08-01 MED ORDER — KETOROLAC TROMETHAMINE 30 MG/ML IJ SOLN
30.0000 mg | Freq: Four times a day (QID) | INTRAMUSCULAR | Status: AC
  Filled 2014-08-01: qty 1

## 2014-08-01 MED ORDER — DOCUSATE SODIUM 100 MG PO CAPS
100.0000 mg | ORAL_CAPSULE | Freq: Two times a day (BID) | ORAL | Status: DC
  Administered 2014-08-01 – 2014-08-03 (×4): 100 mg via ORAL
  Filled 2014-08-01 (×6): qty 1

## 2014-08-01 MED ORDER — LORAZEPAM 0.5 MG PO TABS
0.5000 mg | ORAL_TABLET | Freq: Three times a day (TID) | ORAL | Status: DC | PRN
  Administered 2014-08-01 – 2014-08-02 (×3): 0.5 mg via ORAL
  Filled 2014-08-01 (×3): qty 1

## 2014-08-01 MED ORDER — KETOROLAC TROMETHAMINE 30 MG/ML IJ SOLN
30.0000 mg | Freq: Four times a day (QID) | INTRAMUSCULAR | Status: AC
  Administered 2014-08-01 – 2014-08-02 (×4): 30 mg via INTRAVENOUS
  Filled 2014-08-01 (×3): qty 1

## 2014-08-01 NOTE — Progress Notes (Signed)
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 No nausea, no flatus.    Objective: Vital signs in last 24 hours: Temp:  [97.9 F (36.6 C)-98.7 F (37.1 C)] 98.6 F (37 C) (06/08 0958) Pulse Rate:  [61-110] 87 (06/08 0958) Resp:  [9-16] 11 (06/08 0958) BP: (125-142)/(61-97) 141/88 mmHg (06/08 0958) SpO2:  [95 %-100 %] 99 % (06/08 0958) Last BM Date: 07/30/14  Intake/Output from previous day: 06/07 0701 - 06/08 0700 In: 3666.7 [I.V.:3566.7; IV Piggyback:100] Out: 2350 [Urine:1150; Blood:300]  Physical Examination: General: alert and cooperative Resp: clear to auscultation bilaterally Cardio: regular rate and rhythm, S1, S2 normal, no murmur, click, rub or gallop GI: soft, non-tender; bowel sounds normal; no masses,  no organomegaly and incision: clean, dry, intact and dressing in place to remain on until POD 7 Extremities: extremities normal, atraumatic, no cyanosis or edema Vaginal Bleeding: none  Labs: WBC/Hgb/Hct/Plts:  13.6/11.3/36.0/291 (06/08 0544) BUN/Cr/glu/ALT/AST/amyl/lip:  11/0.77/--/--/--/--/-- (06/08 0544)   Assessment:  54 y.o. s/p Procedure(s): EXPLORATORY LAPAROTOMY, OMENTECTOMY, TUMOR DEBULKING,TOTAL HYSTERECTOMY WITH BILATERAL SALPINGO OOPHORECTOMY: stable Pain:  Pain is well-controlled on PCA . Will change to oral medications.  Heme:appropriate Hb postop  ID: no issues  CV: hemodynamically stable. No interventions.  .  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 Advance diet to regular.   FEN: hep lock IVF, monitor electrolytes postop and replete prn  Prophylaxis: pharmacologic prophylaxis (with any of the following: enoxaparin (Lovenox)  40mg  SQ 2 hours prior to surgery then every day).  Plan: Advance diet Encourage ambulation Advance to PO medication Discontinue IV fluids  Plan is for discharge on 28 days lovenox prophylaxis Dispo:  Discharge plan to include :consults: none - will go home with family The patient is to be discharged to home. Ancitipate discharge on POD 3.   LOS: 1 day    Donaciano Eva 08/01/2014, 10:10 AM

## 2014-08-02 LAB — CBC WITH DIFFERENTIAL/PLATELET
Basophils Absolute: 0 10*3/uL (ref 0.0–0.1)
Basophils Relative: 0 % (ref 0–1)
EOS PCT: 0 % (ref 0–5)
Eosinophils Absolute: 0 10*3/uL (ref 0.0–0.7)
HCT: 30.7 % — ABNORMAL LOW (ref 36.0–46.0)
Hemoglobin: 9.7 g/dL — ABNORMAL LOW (ref 12.0–15.0)
LYMPHS ABS: 1.5 10*3/uL (ref 0.7–4.0)
Lymphocytes Relative: 17 % (ref 12–46)
MCH: 27.4 pg (ref 26.0–34.0)
MCHC: 31.6 g/dL (ref 30.0–36.0)
MCV: 86.7 fL (ref 78.0–100.0)
Monocytes Absolute: 0.5 10*3/uL (ref 0.1–1.0)
Monocytes Relative: 6 % (ref 3–12)
Neutro Abs: 7.2 10*3/uL (ref 1.7–7.7)
Neutrophils Relative %: 77 % (ref 43–77)
PLATELETS: 219 10*3/uL (ref 150–400)
RBC: 3.54 MIL/uL — ABNORMAL LOW (ref 3.87–5.11)
RDW: 13.3 % (ref 11.5–15.5)
WBC: 9.3 10*3/uL (ref 4.0–10.5)

## 2014-08-02 LAB — BASIC METABOLIC PANEL
Anion gap: 7 (ref 5–15)
BUN: 8 mg/dL (ref 6–20)
CALCIUM: 8.3 mg/dL — AB (ref 8.9–10.3)
CO2: 29 mmol/L (ref 22–32)
Chloride: 104 mmol/L (ref 101–111)
Creatinine, Ser: 0.64 mg/dL (ref 0.44–1.00)
GFR calc non Af Amer: >60 mL/min (ref >60)
GLUCOSE: 134 mg/dL — AB (ref 65–99)
Potassium: 4.1 mmol/L (ref 3.5–5.1)
Sodium: 140 mmol/L (ref 135–145)

## 2014-08-02 LAB — MAGNESIUM: Magnesium: 1.9 mg/dL (ref 1.7–2.4)

## 2014-08-02 NOTE — Progress Notes (Signed)
2 Days Post-Op Procedure(s) (LRB): EXPLORATORY LAPAROTOMY, OMENTECTOMY, TUMOR DEBULKING,TOTAL HYSTERECTOMY WITH BILATERAL SALPINGO OOPHORECTOMY (Bilateral)  Subjective: Patient reports decrease in nausea with no emesis.  Pain controlled with PRN medications.  Very emotional about situation.  Denies flatus or having a bowel movement.  No concerns voiced.    Objective: Vital signs in last 24 hours: Temp:  [98.5 F (36.9 C)-99.2 F (37.3 C)] 99.1 F (37.3 C) (06/09 1353) Pulse Rate:  [91-108] 108 (06/09 1353) Resp:  [16-18] 16 (06/09 1353) BP: (115-139)/(73-98) 139/98 mmHg (06/09 1353) SpO2:  [94 %-98 %] 98 % (06/09 1353) Last BM Date: 07/30/14  Intake/Output from previous day: 06/08 0701 - 06/09 0700 In: 1101.3 [I.V.:1101.3] Out: 2175 [Urine:2150; Emesis/NG output:25]  Physical Examination: General: alert, cooperative and no distress Resp: clear to auscultation bilaterally Cardio: regular rate and rhythm, S1, S2 normal, no murmur, click, rub or gallop GI: incision: abdominal incision with honeycomb dressing intact, no erythema or drainage visualized through the dressing and hypoactive bowel sounds, abdomen soft, non-tympanic, non-distended Extremities: extremities normal, atraumatic, no cyanosis or edema  Labs: WBC/Hgb/Hct/Plts:  9.3/9.7/30.7/219 (06/09 0531) BUN/Cr/glu/ALT/AST/amyl/lip:  8/0.64/--/--/--/--/-- (06/09 0531)  Assessment: 54 y.o. s/p Procedure(s): EXPLORATORY LAPAROTOMY, OMENTECTOMY, TUMOR DEBULKING,TOTAL HYSTERECTOMY WITH BILATERAL SALPINGO OOPHORECTOMY: stable Pain:  Pain is well-controlled on PRN medications.  Heme: Hgb 9.7 and Hct 30.7 this am.  Stable post-operatively.  CV: BP and HR stable.  Continue to monitor with routine vital signs.  GI:  Tolerating po: Yes.  GU: Adequate output reported.    FEN: Stable post-operatively.  Prophylaxis: pharmacologic prophylaxis (with any of the following: enoxaparin (Lovenox) 40mg  SQ 2 hours prior to surgery then  every day) and intermittent pneumatic compression boots.  Plan: Diet as tolerated Encourage ambulation, IS use, deep breathing, and coughing Continue post-operative plan of care per Dr. Delsa Sale and Dr. Denman George   LOS: 2 days    CROSS, MELISSA DEAL 08/02/2014, 3:47 PM

## 2014-08-03 ENCOUNTER — Ambulatory Visit: Payer: 59

## 2014-08-03 ENCOUNTER — Other Ambulatory Visit: Payer: Self-pay | Admitting: *Deleted

## 2014-08-03 ENCOUNTER — Telehealth: Payer: Self-pay | Admitting: *Deleted

## 2014-08-03 DIAGNOSIS — G4700 Insomnia, unspecified: Secondary | ICD-10-CM

## 2014-08-03 DIAGNOSIS — C541 Malignant neoplasm of endometrium: Secondary | ICD-10-CM

## 2014-08-03 LAB — BASIC METABOLIC PANEL
Anion gap: 8 (ref 5–15)
BUN: 11 mg/dL (ref 6–20)
CHLORIDE: 100 mmol/L — AB (ref 101–111)
CO2: 31 mmol/L (ref 22–32)
Calcium: 8.7 mg/dL — ABNORMAL LOW (ref 8.9–10.3)
Creatinine, Ser: 0.7 mg/dL (ref 0.44–1.00)
GFR calc Af Amer: >60 mL/min (ref >60)
GFR calc non Af Amer: >60 mL/min (ref >60)
Glucose, Bld: 119 mg/dL — ABNORMAL HIGH (ref 65–99)
Potassium: 3.8 mmol/L (ref 3.5–5.1)
Sodium: 139 mmol/L (ref 135–145)

## 2014-08-03 LAB — CBC WITH DIFFERENTIAL/PLATELET
Basophils Absolute: 0 10*3/uL (ref 0.0–0.1)
Basophils Relative: 0 % (ref 0–1)
Eosinophils Absolute: 0.2 10*3/uL (ref 0.0–0.7)
Eosinophils Relative: 2 % (ref 0–5)
HEMATOCRIT: 32.7 % — AB (ref 36.0–46.0)
Hemoglobin: 10.2 g/dL — ABNORMAL LOW (ref 12.0–15.0)
Lymphocytes Relative: 23 % (ref 12–46)
Lymphs Abs: 1.8 10*3/uL (ref 0.7–4.0)
MCH: 27.6 pg (ref 26.0–34.0)
MCHC: 31.2 g/dL (ref 30.0–36.0)
MCV: 88.6 fL (ref 78.0–100.0)
Monocytes Absolute: 0.6 10*3/uL (ref 0.1–1.0)
Monocytes Relative: 8 % (ref 3–12)
Neutro Abs: 5.2 10*3/uL (ref 1.7–7.7)
Neutrophils Relative %: 67 % (ref 43–77)
Platelets: 253 10*3/uL (ref 150–400)
RBC: 3.69 MIL/uL — ABNORMAL LOW (ref 3.87–5.11)
RDW: 13.5 % (ref 11.5–15.5)
WBC: 7.8 10*3/uL (ref 4.0–10.5)

## 2014-08-03 LAB — MAGNESIUM: Magnesium: 1.9 mg/dL (ref 1.7–2.4)

## 2014-08-03 MED ORDER — OXYCODONE HCL 5 MG PO TABS: 5.0000 mg | ORAL_TABLET | ORAL | Status: DC | PRN

## 2014-08-03 MED ORDER — IBUPROFEN 800 MG PO TABS: 800.0000 mg | ORAL_TABLET | Freq: Three times a day (TID) | ORAL | Status: DC

## 2014-08-03 MED ORDER — DOCUSATE SODIUM 100 MG PO CAPS: 100.0000 mg | ORAL_CAPSULE | Freq: Two times a day (BID) | ORAL | Status: DC

## 2014-08-03 MED ORDER — IBUPROFEN 800 MG PO TABS: 800.0000 mg | ORAL_TABLET | Freq: Three times a day (TID) | ORAL | Status: DC | PRN

## 2014-08-03 MED ORDER — ZOLPIDEM TARTRATE 5 MG PO TABS: 5.0000 mg | ORAL_TABLET | Freq: Every evening | ORAL | Status: DC | PRN

## 2014-08-03 MED ORDER — ENOXAPARIN (LOVENOX) PATIENT EDUCATION KIT
PACK | Freq: Once | Status: AC
  Administered 2014-08-03: 11:00:00
  Filled 2014-08-03: qty 1

## 2014-08-03 MED ORDER — ONDANSETRON HCL 4 MG PO TABS: 4.0000 mg | ORAL_TABLET | Freq: Four times a day (QID) | ORAL | Status: AC | PRN

## 2014-08-03 MED ORDER — ENOXAPARIN SODIUM 40 MG/0.4ML ~~LOC~~ SOLN: 40.0000 mg | SUBCUTANEOUS | Status: DC

## 2014-08-03 MED ORDER — TRAMADOL HCL 50 MG PO TABS: 50.0000 mg | ORAL_TABLET | Freq: Four times a day (QID) | ORAL | Status: DC | PRN

## 2014-08-03 NOTE — Telephone Encounter (Signed)
Received call from [place] on [address] stating that patient is requesting something to help her sleep once she goes home. Caesar Bookman once I speak to Dr. Denman George I will send something directly to patient's pharmacy. Ambien sent to patient's pharmacy and patient's fiance notified - he is agreeable to pickup medication this afternoon.   While on the phone, patient was scheduled for f/u appt with Dr. Denman George on Wednesday, 08/08/14, at 12:15pm. Elita Quick is agreeable to bring patient to this appt. Told Elita Quick is Ms. Pedro has any questions or concerns prior to the visit to please call our clinic.

## 2014-08-03 NOTE — Discharge Instructions (Signed)
08/03/2014  Return to work: 6 weeks  Activity: 1. Be up and out of the bed during the day.  Take a nap if needed.  You may walk up steps but be careful and use the hand rail.  Stair climbing will tire you more than you think, you may need to stop part way and rest.   2. No lifting or straining for 6 weeks.  3. No driving for 2 weeks.  Do Not drive if you are taking narcotic pain medicine.  4. Shower daily.  Use soap and water on your incision and pat dry; don't rub.   5. No sexual activity and nothing in the vagina for 6 weeks.  Diet: 1. Low sodium Heart Healthy Diet is recommended.  2. It is safe to use a laxative if you have difficulty moving your bowels.   Wound Care: 1. Keep clean and dry.  Shower daily.  Reasons to call the Doctor:   Fever - Oral temperature greater than 100.4 degrees Fahrenheit  Foul-smelling vaginal discharge  Difficulty urinating  Nausea and vomiting  Increased pain at the site of the incision that is unrelieved with pain medicine.  Difficulty breathing with or without chest pain  New calf pain especially if only on one side  Sudden, continuing increased vaginal bleeding with or without clots.  Keep dressing in place until you return for staple removal.   Follow-up: 1. See Everitt Amber in 1 weeks.  Contacts: For questions or concerns you should contact:  Dr. Everitt Amber at [phone]  or at [place]

## 2014-08-03 NOTE — Discharge Summary (Signed)
Physician Discharge Summary  Patient ID: Ana Luna MRN: [id number] DOB/AGE: [birth year] 54 y.o.  Admit date: 07/31/2014 Discharge date: 08/03/2014  Admission Diagnoses: <principal problem not specified>  Discharge Diagnoses:  Active Problems:   Endometrial cancer   Discharged Condition: good  Hospital Course: The patient was admitted for surgery for adenocarcinoma on 07/31/14. Intraoperative findings were of widely metastatic intraperitoneal cancer. 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 It was an optimal cytoreductive effort to <1cm residual disease. She did well postop with no major complications. She was meeting d.c. Criteria on POD 3.  Consults: None  Significant Diagnostic Studies: labs:  CBC    Component Value Date/Time   WBC 7.8 08/03/2014 0542   RBC 3.69* 08/03/2014 0542   HGB 10.2* 08/03/2014 0542   HCT 32.7* 08/03/2014 0542   PLT 253 08/03/2014 0542   MCV 88.6 08/03/2014 0542   MCH 27.6 08/03/2014 0542   MCHC 31.2 08/03/2014 0542   RDW 13.5 08/03/2014 0542   LYMPHSABS 1.8 08/03/2014 0542   MONOABS 0.6 08/03/2014 0542   EOSABS 0.2 08/03/2014 0542   BASOSABS 0.0 08/03/2014 0542      Treatments: surgery: see above  Discharge Exam: Blood pressure 140/91, pulse 92, temperature 99.6 F (37.6 C), temperature source Oral, resp. rate 16, height 5\' 3"  (1.6 m), weight 190 lb (86.183 kg), last menstrual period 07/08/2011, SpO2 96 %. General appearance: alert and cooperative Resp: clear to auscultation bilaterally Cardio: regular rate and rhythm, S1, S2 normal, no murmur, click, rub or gallop GI: soft, non-tender; bowel sounds normal; no masses,  no organomegaly Extremities: extremities normal, atraumatic, no cyanosis or edema Incision/Wound: clean dry and intact. No vaginal bleeding.Dressing in place.  Disposition: Final discharge disposition not confirmed  Discharge Instructions     (HEART FAILURE PATIENTS) Call MD:  Anytime you have any of the following symptoms: 1) 3 pound weight gain in 24 hours or 5 pounds in 1 week 2) shortness of breath, with or without a dry hacking cough 3) swelling in the hands, feet or stomach 4) if you have to sleep on extra pillows at night in order to breathe.    Complete by:  As directed      Call MD for:  difficulty breathing, headache or visual disturbances    Complete by:  As directed      Call MD for:  extreme fatigue    Complete by:  As directed      Call MD for:  hives    Complete by:  As directed      Call MD for:  persistant dizziness or light-headedness    Complete by:  As directed      Call MD for:  persistant nausea and vomiting    Complete by:  As directed      Call MD for:  redness, tenderness, or signs of infection (pain, swelling, redness, odor or green/yellow discharge around incision site)    Complete by:  As directed      Call MD for:  severe uncontrolled pain    Complete by:  As directed      Call MD for:  temperature >100.4    Complete by:  As directed      Diet - low sodium heart healthy    Complete by:  As directed      Diet general    Complete by:  As directed      Driving Restrictions  Complete by:  As directed   No driving for 7 days or until off narcotic pain medication     Increase activity slowly    Complete by:  As directed      Remove dressing in 24 hours    Complete by:  As directed      Sexual Activity Restrictions    Complete by:  As directed   No intercourse for 6 weeks            Medication List    TAKE these medications        docusate sodium 100 MG capsule  Commonly known as:  COLACE  Take 1 capsule (100 mg total) by mouth 2 (two) times daily.     enoxaparin 40 MG/0.4ML injection  Commonly known as:  LOVENOX  Inject 0.4 mLs (40 mg total) into the skin daily.     ibuprofen 200 MG tablet  Commonly known as:  ADVIL,MOTRIN  Take 400 mg by mouth every 6 (six) hours as needed for  headache.     ibuprofen 800 MG tablet  Commonly known as:  ADVIL,MOTRIN  Take 1 tablet (800 mg total) by mouth every 8 (eight) hours as needed.     ondansetron 4 MG tablet  Commonly known as:  ZOFRAN  Take 1 tablet (4 mg total) by mouth every 6 (six) hours as needed for nausea.     oxyCODONE 5 MG immediate release tablet  Commonly known as:  Oxy IR/ROXICODONE  Take 1 tablet (5 mg total) by mouth every 3 (three) hours as needed for severe pain or breakthrough pain.     traMADol 50 MG tablet  Commonly known as:  ULTRAM  Take 1 tablet (50 mg total) by mouth every 6 (six) hours as needed for moderate pain.           Follow-up Information    Follow up with Donaciano Eva, MD On 08/09/2014.   Specialty:  Obstetrics and Gynecology   Why:  For suture removal   Contact information:   [place] [address] [phone]       Signed: Donaciano Eva 08/03/2014, 9:18 AM

## 2014-08-08 ENCOUNTER — Encounter: Payer: Self-pay | Admitting: Gynecologic Oncology

## 2014-08-08 ENCOUNTER — Ambulatory Visit: Payer: 59 | Attending: Gynecologic Oncology | Admitting: Gynecologic Oncology

## 2014-08-08 VITALS — BP 151/92 | HR 102 | Temp 99.4°F | Resp 18 | Ht 63.0 in | Wt 181.3 lb

## 2014-08-08 DIAGNOSIS — C5701 Malignant neoplasm of right fallopian tube: Secondary | ICD-10-CM | POA: Insufficient documentation

## 2014-08-08 DIAGNOSIS — Z7189 Other specified counseling: Secondary | ICD-10-CM

## 2014-08-08 DIAGNOSIS — C7962 Secondary malignant neoplasm of left ovary: Secondary | ICD-10-CM

## 2014-08-08 DIAGNOSIS — Z90710 Acquired absence of both cervix and uterus: Secondary | ICD-10-CM | POA: Insufficient documentation

## 2014-08-08 DIAGNOSIS — Z90722 Acquired absence of ovaries, bilateral: Secondary | ICD-10-CM | POA: Insufficient documentation

## 2014-08-08 DIAGNOSIS — C7961 Secondary malignant neoplasm of right ovary: Secondary | ICD-10-CM | POA: Diagnosis not present

## 2014-08-08 DIAGNOSIS — Z4802 Encounter for removal of sutures: Secondary | ICD-10-CM | POA: Insufficient documentation

## 2014-08-08 DIAGNOSIS — C786 Secondary malignant neoplasm of retroperitoneum and peritoneum: Secondary | ICD-10-CM | POA: Insufficient documentation

## 2014-08-08 DIAGNOSIS — C7982 Secondary malignant neoplasm of genital organs: Secondary | ICD-10-CM | POA: Insufficient documentation

## 2014-08-08 DIAGNOSIS — C541 Malignant neoplasm of endometrium: Secondary | ICD-10-CM

## 2014-08-08 DIAGNOSIS — C5700 Malignant neoplasm of unspecified fallopian tube: Secondary | ICD-10-CM | POA: Insufficient documentation

## 2014-08-08 DIAGNOSIS — C7989 Secondary malignant neoplasm of other specified sites: Secondary | ICD-10-CM | POA: Diagnosis not present

## 2014-08-08 MED ORDER — OXYCODONE-ACETAMINOPHEN 5-325 MG PO TABS
2.0000 | ORAL_TABLET | Freq: Once | ORAL | Status: AC
  Administered 2014-08-08: 2 via ORAL

## 2014-08-08 MED ORDER — TRAMADOL HCL 50 MG PO TABS: 50.0000 mg | ORAL_TABLET | Freq: Four times a day (QID) | ORAL | Status: DC | PRN

## 2014-08-08 MED ORDER — OXYCODONE HCL 5 MG PO TABS: 5.0000 mg | ORAL_TABLET | ORAL | Status: DC | PRN

## 2014-08-08 NOTE — Patient Instructions (Addendum)
Followup with Dr. Denman George in 6 weeks as scheduled above. Please call us sooner with any questions or concerns you have.   Plan to see Dr. Evlyn Clines at the cancer center to discuss chemotherapy.

## 2014-08-08 NOTE — Progress Notes (Signed)
POSTOPERATIVE FOLLOWUP VISIT  Chief Complaint:  Stage IIIC fallopian tube cancer found incidentally at time of surgery. Treatment counseling.  Assessment:    54 y.o. 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   S/p exploratory laparotomy, BSO, omentectomy, radical tumor debulking with optimal cytoreduction on 07/31/14. Doing well postop from a surgical standpoint. Medically cleared to begin chemotherapy.   Plan: 1) Pathology reports reviewed today 2) Treatment counseling - I spent 30 minutes in direct face to face counseling with Ana Luna, her fiancee and her friend and discussed the diagnosis, the etiology, the recommended treatment course, and prognosis. Of note, it was later observed that a recording device was being used by the patient's family.   I recommended that Ana Luna be treated with combination platinum and paclitaxel chemotherapy. We discussed options for IV or IP chemotherapy. I discussed the potential improved survival with IP chemotherapy, thought with increased toxicity and decreased quality of life for approximately 1 year, and recent clinical trials demonstrating no different in progression free survival when IP is compared with modern day intravenous regimens. I discussed that she would determine which option she would prefer and discuss further with her treating medical oncologist. I discussed that we are unable to offer IP chemotherapy at [facility], however we could facilitate this at [facility] if she elects for this route of therapy.  We will schedule her with pretreatment CT imaging of the chest, abdomen and pelvis to document disease status and location prior to commencing therapy.  She was given the opportunity to ask questions, which were answered to her satisfaction, and she is agreement with the above mentioned plan of care.  3)  Return to clinic in 6 weeks for comprehensive postop check and evaluation of vaginal cuff.  HPI:  Ana Luna is a  54 y.o. year old initially seen in consultation on 06/11/14 referred by Dr Landry Mellow for presumed cervical or endometrial cancer.    The patient underwent routine cervical cytologic assessment of the cervix (pap test) in March, 2016. Of note the patient has been compliant with Pap smear surveillance in previous years. Her pap from 05/16/2014 showed adenocarcinoma and when reviewed by Dr. Lyndon Code from [facility] pathology was felt to favor an endometrioid origin. High-risk HPV was not detected in the sample. The patient was then seen by OB/GYN Dr. Christophe Louis who performed endometrial sampling with a pipelle in the office. This was performed on 05/29/2014. This biopsy was suspicious for adenocarcinoma with fragments of in active endometrium and microscopic detached atypical glandular fragments which is suspicious for adenocarcinoma.  To differentiate between cervical vs endometrial primary, she underwent an MRI of the pelvis on 06/18/14 which revealed: No evidence of cervical mass on MRI. No evidence of endometrial mass on MRI. Endometrial complex measures 7 mm. Uterine adenomyosis. 2.5 cm enhancing intramural lesion in the left uterine body, favored to reflect an intramural fibroid, less likely focal adenomyosis. A small amount of pelvic fluid was noted. No lymphadenopathy was seen.  She was offered a surgical date in May, 2016 for robotic hysterectomy, BSO and lymphadenectomy for presumed endometrial cancer, however the patient declined the earlier date in favor of the previously scheduled date on June 7th.  She then underwent a diagnostic laparoscopy converted to laparotomy, TAH, BSO, omentectomy and radical tumor debulking on 07/28/76 without complications. An optimal cytoreduction was achieved with <1cm3 implants of residual tumor in 2 locations on the hepatic capsule and the posterior pelvic cul de sac. Her postoperative  course was uncomplicated.  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   She is seen today for a postoperative check and to discuss her pathology results and ongoing plan.  Since discharge from the hospital, she is feeling increasingly well.  She has improving appetite, normal bowel and bladder function, and pain controlled with minimal PO medication. She has no other complaints today. She is here for staple removal.    Review of systems: Constitutional:  She has no weight gain or weight loss. She has no fever or chills. Eyes: No blurred vision Ears, Nose, Mouth, Throat: No dizziness, headaches or changes in hearing. No mouth sores. Cardiovascular: No chest pain, palpitations or edema. Respiratory:  No shortness of breath, wheezing or cough Gastrointestinal: She has normal bowel movements without diarrhea or constipation. She denies any nausea or vomiting. She denies blood in her stool or heart burn. Genitourinary:  She denies pelvic pain, pelvic pressure or changes in her urinary function. She has no hematuria, dysuria, or incontinence. She has no irregular vaginal bleeding or vaginal discharge Musculoskeletal: Denies muscle weakness or joint pains.  Skin:  She has no skin changes, rashes or itching Neurological:  Denies dizziness or headaches. No neuropathy, no numbness or tingling. Psychiatric:  She denies depression or anxiety. Hematologic/Lymphatic:   No easy bruising or bleeding   Physical Exam: Blood pressure 151/92, pulse 102, temperature 99.4 F (37.4 C), temperature source Oral, resp. rate 18, height 5\' 3"  (1.6 m), weight 181 lb 4.8 oz (82.237 kg), last menstrual period 07/08/2011, SpO2 99 %. General: Well dressed, well nourished in no apparent distress.   HEENT:  Normocephalic and atraumatic, no lesions.  Extraocular muscles intact. Sclerae anicteric. Pupils equal, round, reactive. No mouth sores or ulcers. Thyroid is normal size, not nodular, midline. Skin:  No lesions  or rashes. Lungs:  Clear to auscultation bilaterally.  No wheezes. Cardiovascular:  Regular rate and rhythm.  No murmurs or rubs. Abdomen:  Soft, nontender, nondistended.  No palpable masses.  No hepatosplenomegaly.  No ascites. Normal bowel sounds.  No hernias.  Incision is healing well, staples in place and removed. No evidence for infection. Genitourinary: Normal EGBUS  Vaginal cuff intact.  No bleeding or discharge.  No cul de sac fullness. Extremities: No cyanosis, clubbing or edema.  No calf tenderness or erythema. No palpable cords. Psychiatric: Mood and affect are appropriate. Neurological: Awake, alert and oriented x 3. Sensation is intact, no neuropathy.  Musculoskeletal: No pain, normal strength and range of motion.   Donaciano Eva, MD

## 2014-08-09 ENCOUNTER — Encounter: Payer: Self-pay | Admitting: *Deleted

## 2014-08-09 NOTE — Progress Notes (Signed)
Late entry - Staples removed from abdominal incision at visit with Dr. Denman George yesterday. 25 staples removed from midline incision and 2 staples removed from left upper abdomen surgical port site. Staples removed without difficultly, incision sites clean, dry, and intact. No swelling or drainage noted at sites. Steri-strips and benzoin applied to incision. Patient instructed that steri-strips will fall off in approximately 1 weeks time and that she can shower and pat incision dry afterwards. No other questions or concerns noted at this time.

## 2014-08-12 ENCOUNTER — Other Ambulatory Visit: Payer: Self-pay | Admitting: Oncology

## 2014-08-12 DIAGNOSIS — C5701 Malignant neoplasm of right fallopian tube: Secondary | ICD-10-CM

## 2014-08-14 ENCOUNTER — Ambulatory Visit (HOSPITAL_COMMUNITY)
Admission: RE | Admit: 2014-08-14 | Discharge: 2014-08-14 | Disposition: A | Discharge status: Home / Self Care | Payer: 59 | Source: Ambulatory Visit | Attending: Gynecologic Oncology | Admitting: Gynecologic Oncology

## 2014-08-14 ENCOUNTER — Encounter (HOSPITAL_COMMUNITY): Payer: Self-pay

## 2014-08-14 DIAGNOSIS — C5700 Malignant neoplasm of unspecified fallopian tube: Secondary | ICD-10-CM

## 2014-08-14 DIAGNOSIS — Z90710 Acquired absence of both cervix and uterus: Secondary | ICD-10-CM | POA: Insufficient documentation

## 2014-08-14 DIAGNOSIS — Z9221 Personal history of antineoplastic chemotherapy: Secondary | ICD-10-CM | POA: Diagnosis not present

## 2014-08-14 MED ORDER — IOHEXOL 300 MG/ML  SOLN
100.0000 mL | Freq: Once | INTRAMUSCULAR | Status: AC | PRN
  Administered 2014-08-14: 100 mL via INTRAVENOUS

## 2014-08-15 ENCOUNTER — Other Ambulatory Visit: Payer: 59

## 2014-08-15 ENCOUNTER — Telehealth: Payer: Self-pay | Admitting: *Deleted

## 2014-08-15 ENCOUNTER — Ambulatory Visit: Payer: 59

## 2014-08-15 ENCOUNTER — Telehealth: Payer: Self-pay | Admitting: Oncology

## 2014-08-15 NOTE — Telephone Encounter (Signed)
No additional note

## 2014-08-15 NOTE — Telephone Encounter (Signed)
returned call and s.w. pt and r/s appt due to being sick.Ana KitchenMarland KitchenMarland Luna

## 2014-08-16 ENCOUNTER — Other Ambulatory Visit: Payer: Self-pay | Admitting: Oncology

## 2014-08-16 ENCOUNTER — Telehealth: Payer: Self-pay | Admitting: Oncology

## 2014-08-16 ENCOUNTER — Other Ambulatory Visit: Payer: 59

## 2014-08-16 ENCOUNTER — Other Ambulatory Visit (HOSPITAL_BASED_OUTPATIENT_CLINIC_OR_DEPARTMENT_OTHER): Payer: 59

## 2014-08-16 ENCOUNTER — Ambulatory Visit (HOSPITAL_BASED_OUTPATIENT_CLINIC_OR_DEPARTMENT_OTHER): Payer: 59 | Admitting: Oncology

## 2014-08-16 ENCOUNTER — Encounter: Payer: Self-pay | Admitting: Oncology

## 2014-08-16 ENCOUNTER — Ambulatory Visit: Payer: 59

## 2014-08-16 ENCOUNTER — Telehealth: Payer: Self-pay | Admitting: *Deleted

## 2014-08-16 VITALS — BP 143/87 | HR 104 | Temp 98.8°F | Resp 16 | Ht 63.0 in | Wt 180.5 lb

## 2014-08-16 DIAGNOSIS — C5701 Malignant neoplasm of right fallopian tube: Secondary | ICD-10-CM

## 2014-08-16 DIAGNOSIS — K5904 Chronic idiopathic constipation: Secondary | ICD-10-CM

## 2014-08-16 LAB — CBC WITH DIFFERENTIAL/PLATELET
BASO%: 0.1 % (ref 0.0–2.0)
Basophils Absolute: 0 10*3/uL (ref 0.0–0.1)
EOS%: 3.1 % (ref 0.0–7.0)
Eosinophils Absolute: 0.2 10*3/uL (ref 0.0–0.5)
HCT: 35 % (ref 34.8–46.6)
HGB: 11.4 g/dL — ABNORMAL LOW (ref 11.6–15.9)
LYMPH%: 15.5 % (ref 14.0–49.7)
MCH: 28 pg (ref 25.1–34.0)
MCHC: 32.6 g/dL (ref 31.5–36.0)
MCV: 86 fL (ref 79.5–101.0)
MONO#: 0.4 10*3/uL (ref 0.1–0.9)
MONO%: 5.7 % (ref 0.0–14.0)
NEUT#: 5.2 10*3/uL (ref 1.5–6.5)
NEUT%: 75.6 % (ref 38.4–76.8)
PLATELETS: 267 10*3/uL (ref 145–400)
RBC: 4.07 10*6/uL (ref 3.70–5.45)
RDW: 13.6 % (ref 11.2–14.5)
WBC: 6.8 10*3/uL (ref 3.9–10.3)
lymph#: 1.1 10*3/uL (ref 0.9–3.3)

## 2014-08-16 LAB — COMPREHENSIVE METABOLIC PANEL (CC13)
ALBUMIN: 3.5 g/dL (ref 3.5–5.0)
ALT: 69 U/L — ABNORMAL HIGH (ref 0–55)
AST: 38 U/L — ABNORMAL HIGH (ref 5–34)
Alkaline Phosphatase: 72 U/L (ref 40–150)
Anion Gap: 8 mEq/L (ref 3–11)
BILIRUBIN TOTAL: 0.53 mg/dL (ref 0.20–1.20)
BUN: 16.4 mg/dL (ref 7.0–26.0)
CHLORIDE: 103 meq/L (ref 98–109)
CO2: 29 meq/L (ref 22–29)
CREATININE: 0.8 mg/dL (ref 0.6–1.1)
Calcium: 9.3 mg/dL (ref 8.4–10.4)
EGFR: 84 mL/min/{1.73_m2} — AB (ref >90)
Glucose: 134 mg/dl (ref 70–140)
Potassium: 3.7 mEq/L (ref 3.5–5.1)
Sodium: 141 mEq/L (ref 136–145)
Total Protein: 6.5 g/dL (ref 6.4–8.3)

## 2014-08-16 NOTE — Telephone Encounter (Signed)
Gave avs & calendar for July °

## 2014-08-16 NOTE — Telephone Encounter (Signed)
No additional note

## 2014-08-16 NOTE — Progress Notes (Signed)
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 History is from gyn oncology, this EMR and patient/ family now.    Patient saw PCP Dr Zadie Rhine with complaint of postcoital spotting and increased watery vaginal discharge, as well as increased constipation and gas.  PAP 05-16-14 ([id number]) by Dr Radene Ou had adenocarcinoma. High risk HPV was negative. She was referred to Dr Christophe Louis, with pipelle endometrial sampling 05-29-14 suspicious for adenocarcinoma. Dr Denman George did colposcopy with biopsy and endocervical curretting on 06-11-14, with no malignancy identified  ([id number]). She had MRI pelvis 06-18-14, with no definite endometrial or cervical mass and adnexa not remarkable, small pelvic ascites and no pelvic adenopathy. 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 She was offered surgery in May, but preferred to have this in June. Procedure was laparoscopy converted to laparotomy by Dr Denman George on 07-31-2014, with TAH, BSO, omentectomy and radical tumor debulking with was optimal cytoreduction (R1) with <1 cm3 disease remaining on hepatic capsule and posterior cul de sac at completion. Intraoperatively there was large omental cake replacing entire omentum from greater curvature to infracolic omentum and from hepatic flexure to splenic flexure, 1000cc ascites, 12 wk size uterus with grossly normal cervix,  ovaries and tubes replaced by tumor and <1 cm nodules on hepatic capsule. 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  Postoperative course was uncomplicated and she was discharged home on POD 3, on prophylactic lovenox. She saw Dr Denman George on 08-08-14 with discussion of pathology and discussion of IV vs IP chemotherapy. CT CAP was done on  08-14-14, showing small bilateral pleural effusions, peritoneal nodularity in greater omentum and gastrosplenic ligament, area at falciform ligament questionably subcapsular met, small common ilita nodes, thickening of underside of right rectus abdominus questionably post operative.  Dr Denman George plans to see her next for 6 week follow up visit in July. Patient cancelled chemotherapy education class scheduled yesterday, so has not had the chemotherapy teaching as yet.    No PAC No genetics testing No CA 125 preop   REVIEW OF SYSTEMS: Pain mostly controlled with tramadol + oxycodone + ibuprofen in AM and tramadol + oxycodone at hs. Has been constipated, last BM yesterday, using stool softener with OTC laxative once or twice daily. Drinking fluids well but appetite not good, no nausea. Some hot flashes prior to surgery, increased now. No bladder symptoms. Slight vaginal spotting now, otherwise no bleeding. Taking the LMW heparin qAM, fiance giving the injections. No swelling or pain LE. Breathing ok, no chest pain. No peripheral neuropathy. No noted changes in breasts. Occasional HA since surgery, mild. Wears contacts. No known dental problems. No difficulty hearing.   Remainder of full 10 point review of systems negative.   ALLERGIES: Review of patient's allergies indicates no known allergies.  PAST MEDICAL/ SURGICAL HISTORY:    [id number] Menopausal  x 1 year [facility] recluse spider bite to buttock 2011 requiring  debridement Benign left breast biopsy 2006 [place] 06-07-14 without findings of concern Colonoscopy [facility] spring 2016, next in 10 years.  CURRENT MEDICATIONS: reviewed as listed now in EMR  PHARMACY: [facility]   SOCIAL HISTORY:  From [place], in [place] x 4 years. Divorced. Lives with finace and one son, other son living in [place]. 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 She went out of work on 07-30-14 and is currently out thru 09-26-14. No tobacco, no ETOH, no transfusions.   FAMILY HISTORY:  Diabetes in both parents, father also with cardiac disease Sons healthy Sister post hysterectomy "as prevention" Only known Ca was breast cancer in great aunt          PHYSICAL EXAM:  height is 5' 3"  (1.6 m) and weight is 180 lb 8 oz (81.874 kg). Her oral temperature is 98.8 F (37.1 C). Her blood pressure is 143/87 and her pulse is 104. Her respiration is 16 and oxygen saturation is 100%.  Alert, pleasant, cooperative lady who appears moderately uncomfortable, changes position frequently. Respirations not labored. Ambulatory without assistance  HEENT:normal hair pattern. PERRL, not icteric. Oral mucosa moist and clear, good dentition. Neck supple without thyroid mass or JVD.  RESPIRATORY:lungs clear to A and P  CARDIAC/ VASCULAR:heart RRR, no gallop, clear heart sounds. Peripheral pulses 2+ symmetrical. Peripheral veins look adequate for chemotherapy  ABDOMEN: somewhat distended, quiet, not tender to gentle exam. Surgical incision closed, staples out, no erythema or drainage. Cannot appreciate organomegaly or mass. Slightly tender epigastrium  LYMPH NODES: no cervical, supraclavicular, axillary or inguinal adenopathy  BREASTS: bilaterally without dominant mass, skin or nipple findings. Axillae benign  NEUROLOGIC: speech fluent and appropriate. CN, motor, sensory, cerebellar nonfocal. PSYCH  Appropriate mood and affect  SKIN:  without ecchymosis, rash, petechiae  MUSCULOSKELETAL:back not tender. Extremities without edema, cords, tenderness. Good and symmetrical muscle mass    LABORATORY DATA:  Results for orders placed or performed in visit on 08/16/14 (from the past 48 hour(s))  CBC with Differential     Status: Abnormal   Collection Time: 08/16/14 10:07 AM  Result Value Ref Range   WBC 6.8 3.9 - 10.3 10e3/uL   NEUT# 5.2 1.5 - 6.5 10e3/uL   HGB 11.4 (L) 11.6 - 15.9 g/dL   HCT 35.0 34.8 - 46.6 %   Platelets 267 145 - 400 10e3/uL   MCV 86.0 79.5 - 101.0 fL   MCH 28.0 25.1 - 34.0 pg   MCHC 32.6 31.5 - 36.0 g/dL   RBC 4.07 3.70 - 5.45 10e6/uL   RDW 13.6 11.2 - 14.5 %   lymph# 1.1 0.9 - 3.3 10e3/uL   MONO# 0.4 0.1 - 0.9 10e3/uL   Eosinophils Absolute 0.2 0.0 - 0.5 10e3/uL   Basophils Absolute 0.0 0.0 - 0.1 10e3/uL   NEUT% 75.6 38.4 - 76.8 %   LYMPH% 15.5 14.0 - 49.7 %   MONO% 5.7 0.0 - 14.0 %   EOS% 3.1 0.0 - 7.0 %   BASO% 0.1 0.0 - 2.0 %  Comprehensive metabolic panel (Cmet) - [facility]     Status: Abnormal   Collection Time: 08/16/14 10:07 AM  Result Value Ref Range   Sodium 141 136 - 145 mEq/L   Potassium 3.7 3.5 - 5.1 mEq/L   Chloride 103 98 - 109 mEq/L   CO2 29 22 - 29 mEq/L   Glucose  134 70 - 140 mg/dl   BUN 16.4 7.0 - 26.0 mg/dL   Creatinine 0.8 0.6 - 1.1 mg/dL   Total Bilirubin 0.53 0.20 - 1.20 mg/dL   Alkaline Phosphatase 72 40 - 150 U/L   AST 38 (H) 5 - 34 U/L   ALT 69 (H) 0 - 55 U/L   Total Protein 6.5 6.4 - 8.3 g/dL   Albumin 3.5 3.5 - 5.0 g/dL   Calcium 9.3 8.4 - 10.4 mg/dL   Anion Gap 8 3 - 11 mEq/L   EGFR 84 (L) >90 ml/min/1.73 m2    Comment: eGFR is calculated using the CKD-EPI Creatinine Equation (2009)     CA 125 available after visit 180, which could reflect recent surgery. No baseline preoperatively   PATHOLOGY:  GRACELYN, COVENTRY Collected: 05/16/2014 Client: [facility] Accession: [id number] Received: 05/16/2014 Milagros Evener, [place]  CYTOLOGY REPORT Adequacy Reason Satisfactory for evaluation, endocervical/transformation zone component PRESENT. Diagnosis ADENOCARCINOMA. SEE COMMENT. COMMENT: THE FEATURES FAVOR AN ENDOMETRIOID ORIGIN.    [facility] Collected: 06/11/2014 Client: [facility] Accession: [id number] Received: 06/11/2014 Everitt Amber, MD  DIAGNOSIS Diagnosis 1. Cervix, biopsy, ectocervix 12 o'clock - BENIGN CERVICAL MUCOSA. - NO DYSPLASIA OR MALIGNANCY. 2. Cervix, biopsy, transformation zone 12 o'clock - BENIGN CERVICAL MUCOSA. - NO DYSPLASIA OR MALIGNANCY. 3. Endocervix, curettage - BENIGN ENDOCERVIX. - NO DYSPLASIA OR MALIGNANCY.     FINAL for KAYLAANN, MOUNTZ ([id number]) Patient: ROSAMAE, ROCQUE Collected: 07/31/2014 Client: [facility] Accession: [id number] Received: 07/31/2014 Everitt Amber REPORT OF SURGICAL PATHOLOGY FINAL DIAGNOSIS Diagnosis 1. Omentum, resection for tumor - PAPILLARY SEROUS ADENOCARCINOMA. - LYMPHOVASCULAR INVASION IS IDENTIFIED. 2. Uterus +/- tubes/ovaries, neoplastic - PAPILLARY SEROUS ADENOCARCINOMA, DIFFUSELY INVOLVING BILATERAL ADNEXA AND SEROSA. - BASED ON THE HISTOLOGIC FEATURES, THE PRIMARY SITE IS RIGHT FALLOPIAN TUBE. - DIFFUSE LYMPHOVASCULAR INVASION IS IDENTIFIED. - SEE ONCOLOGY TABLE BELOW. Microscopic Comment 2. ONCOLOGY TABLE - FALLOPIAN TUBE 1. Specimen, including laterality: Omentum and uterus and bilateral adnexae 2. Procedure: Omental resection and hysterectomy and bilateral salpingo-oopherectomy 3. Lymph node sampling performed: No 4. Tumor site: Primary site favored to be right fallopian tube 5. Tumor location in fallopian tube: Unknown 6. Specimen integrity (intact/ruptured/disrupted): Intact 7. Tumor size (cm): Unknown 8. Histologic type: Papillary serous adenocarcinoma 9. Grade: High grade 10. Microscopic tumor extension: Involves bilateral adnexae and uterine serosa 11. Margins: N/A 12. Lymph-Vascular invasion: Present,  diffuse 13. Lymph nodes: # examined: 0; 14. TNM: at least pT3, pNX 15. FIGO Stage (based on pathologic findings, needs clinical correlation: At least stage III 16. Comments: There is diffuse involvement of omentum, bilateral adnexae, and uterine serosa of high grade papillary serous adenocarcinoma. There is adenocarcinoma in situ present in the right fallopian tube mucosa, thus this is likely the primary site. Because of the omental involvement, the carcinoma is at least stage T3.  Intraoperative Diagnosis 1. OMENTUM, RESECTION, FROZEN SECTION: POSITIVE FOR CARCINOMA, FAVOR ADENOCARCINOMA. (RAH)     RADIOGRAPHY: EXAM: MRI PELVIS WITHOUT AND WITH CONTRAST  TECHNIQUE: Multiplanar multisequence MR imaging of the pelvis was performed both before and after administration of intravenous contrast.  CONTRAST: 63m MULTIHANCE GADOBENATE DIMEGLUMINE 529 MG/ML IV SOLNa  COMPARISON: None.  FINDINGS: Uterus measures 6.0 x 7.9 x 6.5 cm. Endometrial complex measures 7 mm. No definite endometrial mass is visualized.  Thickening/enlargement of the posterior junctional zone of the myometrium (series 6/ image 14), with scattered ectopic endometrial glands, compatible with adenomyosis.  Ill-defined 2.3 x 2.5 x 2.3 cm lesion in the  left uterine body (series 3/image 18), which enhances following contrast administration (series 903/ image 66), favored to reflect intramural fibroid or less likely focal adenomyosis. This lesion is not contiguous with the endometrium or the cervix.  The cervix is notable for nabothian cysts but is otherwise within normal limits (series 6/image 14). No definite endocervical mass is visualized.  Right ovary measures 3.2 x 1.9 cm (series 3/image 15) and is notable for an involuting corpus luteal cyst.  Left ovary measures 1.2 x 2.1 cm and is within normal limits.  Bladder is within normal limits.  Small volume pelvic ascites.  No suspicious  pelvic lymphadenopathy.  No focal osseous lesions.  IMPRESSION: No evidence of cervical mass on MRI.  No evidence of endometrial mass on MRI. Endometrial complex measures 7 mm.  Uterine adenomyosis.  2.5 cm enhancing intramural lesion in the left uterine body, favored to reflect an intramural fibroid, less likely focal adenomyosis.     CT CHEST, ABDOMEN, AND PELVIS WITH CONTRAST   08-14-14  COMPARISON: None.  MRI abdomen 06/18/2014  FINDINGS: CT CHEST FINDINGS  Mediastinum/Nodes: No axillary or supraclavicular lymphadenopathy. No mediastinal hilar lymphadenopathy. No pericardial fluid. No central pulmonary embolism. Esophagus normal  Lungs/Pleura: There is linear left basilar atelectasis. Small bilateral pleural effusions. No measurable pulmonary nodularity.  CT ABDOMEN AND PELVIS FINDINGS  Hepatobiliary: IRounded low-attenuation region along the falciform ligament measuring 2.4 by 2.2 cm on image 54, series 2. Low-density region borders the cleft of falciform ligament seen on coronal image 25. No biliary duct dilatation the gallbladder is normal.  Pancreas: Pancreas is normal. No ductal dilatation. No pancreatic inflammation.  Spleen: Normal spleen  Adrenals/urinary tract: Adrenal glands and kidneys are normal. The ureters and bladder normal. Delayed renal imaging which isno abnormality proximal ureters. There is minimal flow the bladder on delayed imaging but no evidence leak.  Stomach/Bowel: Stomach, small bowel, appendix and cecum normal. The colon has a moderate volume stool throughout course and colon. Large stool ball in the rectum.  Vascular/Lymphatic: Abdominal aorta normal caliber. No periportal lymphadenopathy. Left common iliac lymph node measures 9 mm at the bifurcation on image 81, series 2. Small right common iliac node measures 7 mm on image 86.  Reproductive: Post hysterectomy. Consider oophorectomy. No nodularity at  vaginal cuff. Nosidewall mass.  Musculoskeletal: No aggressive osseous lesion.  Other: There is nodularity within the ventral peritoneal space adjacent to the greater curvature of the stomach as well as in the gastrosplenic ligament. Discrete nodules measure 9 mm and 9 mm on image 63 and 62 of series 2 within the omentum. Nodularity in the gastrosplenic ligament is less defined. 10 mm nodule on image 50 for example.  There is thickening along the undersurface of the right rectus abdominus muscle at the level of the umbilicus on image 87.  IMPRESSION: Chest Impression:  1. No evidence of thoracic metastasis. 2. Small bilateral pleural effusions.  Abdomen / Pelvis Impression:  1. Evidence of peritoneal disease with nodularity in the greater omentum and the gastrosplenic ligament. 2. Thickening undersurface of the right rectus abdominus muscle may relate to surgery. Cannot exclude ventral peritoneal metastasis. 3. Lesion along the falciform ligament ligament of the liver with differential including subcapsular peritoneal metastasis versus retraction injury from recent surgery. 4. Small common iliac lymph nodes are concerning for nodal metastasis. 5. No evidence the ureter injury.  PACs images reviewed with patient and family at time of visit.     DISCUSSION: All of history above reviewed with patient  and family, including circumstances surrounding diagnosis and initial difficulty identifying primary gyn site as well as clinical apparent earlier stage than what was found at surgery. We have discussed typical patterns of spread of the gyn cancers and importance of good debulking surgery. We have reviewed the findings of staging CT CAP which was done after Dr Serita Grit visit. They have discussed IP vs IV chemotherapy with Dr Denman George, and tell me now that they will be going for second opinion consultation at [facility] (date of visit not known yet). We have talked about standard IV taxol  and carboplatin chemotherapy, either q 3 week or dose dense; as she has not had chemotherapy education class yet, we have not gone into much detail about the chemotherapy at this visit. We have discussed increasing laxatives and stool softeners, as constipation is likely adding to her discomfort now. I have reminded them that the pain medication increases constipation and encouraged her to decrease the pain medication as she is able.  Patient was tired and uncomfortable by this point in visit. I have offered to see her again next week after the chemo education class; she understands that she can call prior to that visit if needed. She will let us know if timing of the [facility] visit needs any changes in appointments here.     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 Adjuvant chemotherapy recommended. She plans second opinion consultation at [facility] prior to making decision. She will be rescheduled for chemotherapy education class and I am glad to see her back on 08-24-14 to continue to address situation. Hopefully she will be begin treatment within next 2 weeks, tho she will likely tolerate this better if constipation, pain and oral intake are improved prior.  2.up to date on mammograms, previous benign breast biopsy 3.unremarkable colonoscopy spring 2016, next recommended 1 year. 4.peripheral venous access appears adequate for chemotherapy     Patient and accompanying individuals have had questions answered to their satisfaction and are in agreement with plan above. They can contact this office for questions or concerns at any time prior to next scheduled visit.  Time spent  55 min  , including >50% discussion and coordination of care.    Gordy Levan, MD 08/16/2014 8:34 PM

## 2014-08-16 NOTE — Progress Notes (Signed)
Checked in new pt with no financial concerns. °

## 2014-08-17 ENCOUNTER — Other Ambulatory Visit: Payer: 59

## 2014-08-17 LAB — CA 125: CA 125: 180 U/mL — AB (ref <35)

## 2014-08-18 DIAGNOSIS — K5904 Chronic idiopathic constipation: Secondary | ICD-10-CM | POA: Insufficient documentation

## 2014-08-18 DIAGNOSIS — C5700 Malignant neoplasm of unspecified fallopian tube: Secondary | ICD-10-CM | POA: Insufficient documentation

## 2014-08-24 ENCOUNTER — Other Ambulatory Visit: Payer: 59

## 2014-08-24 ENCOUNTER — Other Ambulatory Visit: Payer: Self-pay | Admitting: *Deleted

## 2014-08-24 ENCOUNTER — Ambulatory Visit (HOSPITAL_BASED_OUTPATIENT_CLINIC_OR_DEPARTMENT_OTHER): Payer: 59 | Admitting: Oncology

## 2014-08-24 ENCOUNTER — Encounter: Payer: Self-pay | Admitting: Oncology

## 2014-08-24 ENCOUNTER — Other Ambulatory Visit (HOSPITAL_BASED_OUTPATIENT_CLINIC_OR_DEPARTMENT_OTHER): Payer: 59

## 2014-08-24 ENCOUNTER — Other Ambulatory Visit: Payer: Self-pay | Admitting: Oncology

## 2014-08-24 ENCOUNTER — Telehealth: Payer: Self-pay | Admitting: Oncology

## 2014-08-24 VITALS — BP 141/87 | HR 94 | Temp 99.3°F | Resp 18 | Ht 63.0 in | Wt 180.1 lb

## 2014-08-24 DIAGNOSIS — C541 Malignant neoplasm of endometrium: Secondary | ICD-10-CM

## 2014-08-24 DIAGNOSIS — K5904 Chronic idiopathic constipation: Secondary | ICD-10-CM

## 2014-08-24 DIAGNOSIS — C5701 Malignant neoplasm of right fallopian tube: Secondary | ICD-10-CM

## 2014-08-24 LAB — CBC WITH DIFFERENTIAL/PLATELET
BASO%: 0.4 % (ref 0.0–2.0)
Basophils Absolute: 0 10*3/uL (ref 0.0–0.1)
EOS%: 3.5 % (ref 0.0–7.0)
Eosinophils Absolute: 0.3 10*3/uL (ref 0.0–0.5)
HCT: 36.5 % (ref 34.8–46.6)
HGB: 11.9 g/dL (ref 11.6–15.9)
LYMPH%: 16.9 % (ref 14.0–49.7)
MCH: 27.2 pg (ref 25.1–34.0)
MCHC: 32.6 g/dL (ref 31.5–36.0)
MCV: 83.5 fL (ref 79.5–101.0)
MONO#: 0.6 10*3/uL (ref 0.1–0.9)
MONO%: 7.7 % (ref 0.0–14.0)
NEUT%: 71.5 % (ref 38.4–76.8)
NEUTROS ABS: 6 10*3/uL (ref 1.5–6.5)
Platelets: 297 10*3/uL (ref 145–400)
RBC: 4.37 10*6/uL (ref 3.70–5.45)
RDW: 13.8 % (ref 11.2–14.5)
WBC: 8.4 10*3/uL (ref 3.9–10.3)
lymph#: 1.4 10*3/uL (ref 0.9–3.3)

## 2014-08-24 LAB — COMPREHENSIVE METABOLIC PANEL (CC13)
ALT: 40 U/L (ref 0–55)
AST: 24 U/L (ref 5–34)
Albumin: 3.8 g/dL (ref 3.5–5.0)
Alkaline Phosphatase: 72 U/L (ref 40–150)
Anion Gap: 10 mEq/L (ref 3–11)
BUN: 16.4 mg/dL (ref 7.0–26.0)
CO2: 27 mEq/L (ref 22–29)
Calcium: 9.6 mg/dL (ref 8.4–10.4)
Chloride: 104 mEq/L (ref 98–109)
Creatinine: 0.8 mg/dL (ref 0.6–1.1)
EGFR: 87 mL/min/{1.73_m2} — AB (ref >90)
GLUCOSE: 86 mg/dL (ref 70–140)
POTASSIUM: 4.1 meq/L (ref 3.5–5.1)
SODIUM: 141 meq/L (ref 136–145)
Total Bilirubin: 0.46 mg/dL (ref 0.20–1.20)
Total Protein: 6.7 g/dL (ref 6.4–8.3)

## 2014-08-24 MED ORDER — LORAZEPAM 1 MG PO TABS: ORAL_TABLET | ORAL | Status: AC

## 2014-08-24 MED ORDER — DEXAMETHASONE 4 MG PO TABS: ORAL_TABLET | ORAL | Status: AC

## 2014-08-24 MED ORDER — OXYCODONE HCL 5 MG PO TABS: ORAL_TABLET | ORAL | Status: AC

## 2014-08-24 MED ORDER — TRAMADOL HCL 50 MG PO TABS: ORAL_TABLET | ORAL | Status: AC

## 2014-08-24 NOTE — Telephone Encounter (Signed)
Appointments made and avs printed for patient °

## 2014-08-24 NOTE — Patient Instructions (Signed)
We will send prescriptions to your pharmacy   1.decadron (dexamethasone, steroid) 4 mg. Take five tablets +(=20 mg) with food 12 hrs before taxol chemotherapy and five tablets with food 6 hrs before taxol  First chemo will be Friday 08-31-14 at 1030, so you will need to eat a little and take five tablets of the steroid at ~ 1030 PM on Thurs 7-7, and eat a little + take five more tablets at ~ 4:30 AM on Friday 08-31-14.    2.zofran (ondansetron) 8mg  One tablet every 8 hrs as needed for nausea. Will not make you drowsy. Fine to take one tablet AM after chemo whether or not any nausea then, to extend coverage for nausea a bit longer. Other than that dose, fine to take just as needed for nausea   3.ativan (lorazepam) 1 mg. One tablet swallow or dissolve under tongue every 6 hrs as needed for nausea. WIll make you drowsy and a little forgetful around each dose. Fine to take one tablet at bedtime night of chemo whether or not any nausea.    Fine to increase the senna + stool softener to 2 tablets twice daily, to keep bowels moving well daily. Try ambien and tramadol without oxycodone at bedtime.  Try plain tylenol or rest if that helps during day instead of oxycodone; can also use just half tablet of the oxycodone if needed.   Suggest taking claritin 10 mg daily beginning day of chemo or day after chemo for ~ 5 days, as this can help decrease taxol aches.   You can call any time if needed [phone]

## 2014-08-24 NOTE — Progress Notes (Signed)
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  Patient has scheduled a second opinion consultation visit at [facility] on July 22 with Dr Domingo Dimes. Per their conversations with [facility], this was first appointment available and they are comfortable with chemotherapy beginning at this facility prior to consultation at [facility]. She attended chemotherapy education class prior to today's visit.  Patient has been progressively more comfortable over this past week, tho she is still using bid tramadol and total of 3 oxycodone yesterday for generalized abdominal discomfort. Bowels are still not moving well, presently using senokot S 1 in AM and 2 at hs, which she will increase to 2 bid. PO intake has been fairly good, including protein, and she is drinking fluids well. She denies nausea or any bladder symptoms. Hot flashes are more frequent and intense than prior to surgery. She is able to sleep at night using Ambien + pain medication. She has been more active at home. She has 3 days more of prophylactic lovenox. She has minimal vaginal spotting, no other bleeding.  No PAC She is interested in genetics counseling, which we will set up.   ONCOLOGIC HISTORY Patient saw PCP Dr Zadie Rhine with complaint of postcoital spotting and increased watery vaginal discharge, as well as increased constipation and gas. PAP 05-16-14 ([id number]) by Dr Radene Ou had adenocarcinoma. High risk HPV was negative. She was referred to Dr Christophe Louis, with pipelle endometrial sampling 05-29-14 suspicious for adenocarcinoma. Dr Denman George did colposcopy with biopsy and endocervical curretting on 06-11-14, with no malignancy identified  ([id number]). She had MRI pelvis 06-18-14, with no definite endometrial or cervical mass and adnexa not remarkable, small pelvic ascites and no pelvic adenopathy. 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 She was offered surgery in May, but preferred to have this in June. Procedure was laparoscopy converted to laparotomy by Dr Denman George on 07-31-2014, with TAH, BSO, omentectomy and radical tumor debulking with was optimal cytoreduction (R1) with <1 cm3 disease remaining on hepatic capsule and posterior cul de sac at completion. Intraoperatively there was large omental cake replacing entire omentum from greater curvature to infracolic omentum and from hepatic flexure to splenic flexure, 1000cc ascites, 12 wk size uterus with grossly normal cervix, ovaries and tubes replaced by tumor and <1 cm nodules on hepatic capsule. 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 Postoperative course was uncomplicated and she was discharged home on POD 3, on prophylactic lovenox. She saw Dr Denman George on 08-08-14 with discussion of pathology and discussion of IV vs IP chemotherapy. CT CAP was done on 08-14-14, showing small bilateral pleural effusions, peritoneal nodularity in greater omentum and gastrosplenic ligament, area at falciform ligament questionably subcapsular met, small common ilita nodes, thickening of underside of right rectus abdominus questionably post operative.  Dr Denman George plans to see her next for 6 week follow up visit in July.     Review of systems as above, also: No fever or symptoms of infection. No SOB. No LE swelling. Remainder of 10 point Review of Systems negative.  Objective:  Vital signs in last 24 hours:  BP 141/87 mmHg  Pulse 94  Temp(Src) 99.3 F (37.4 C) (Oral)  Resp 18  Ht 5' 3"  (1.6 m)  Wt 180  lb 1.6 oz (81.693 kg)  BMI 31.91 kg/m2  SpO2 100%  LMP 07/08/2011 Weight is stable. Alert, oriented and appropriate. Ambulatory without assistance.   HEENT:PERRL, sclerae not icteric. Oral mucosa moist without lesions, posterior pharynx clear.  Neck supple. No JVD.  Lymphatics:no cervical,supraclavicular, axillary or inguinal adenopathy Resp: clear to auscultation bilaterally and normal percussion bilaterally Cardio: regular rate and rhythm. No gallop. GI: soft, mildly distended, no apparent mass or organomegaly. Minimal bowel sounds. Surgical incision closed, no erythema, nothing of concern. Musculoskeletal/ Extremities: without pitting edema, cords, tenderness Neuro: no peripheral neuropathy. Otherwise nonfocal. PSYCH appears less anxious today, appropriate mood and affect Skin without rash, ecchymosis, petechiae   Lab Results:  Results for orders placed or performed in visit on 08/24/14  CBC with Differential  Result Value Ref Range   WBC 8.4 3.9 - 10.3 10e3/uL   NEUT# 6.0 1.5 - 6.5 10e3/uL   HGB 11.9 11.6 - 15.9 g/dL   HCT 36.5 34.8 - 46.6 %   Platelets 297 145 - 400 10e3/uL   MCV 83.5 79.5 - 101.0 fL   MCH 27.2 25.1 - 34.0 pg   MCHC 32.6 31.5 - 36.0 g/dL   RBC 4.37 3.70 - 5.45 10e6/uL   RDW 13.8 11.2 - 14.5 %   lymph# 1.4 0.9 - 3.3 10e3/uL   MONO# 0.6 0.1 - 0.9 10e3/uL   Eosinophils Absolute 0.3 0.0 - 0.5 10e3/uL   Basophils Absolute 0.0 0.0 - 0.1 10e3/uL   NEUT% 71.5 38.4 - 76.8 %   LYMPH% 16.9 14.0 - 49.7 %   MONO% 7.7 0.0 - 14.0 %   EOS% 3.5 0.0 - 7.0 %   BASO% 0.4 0.0 - 2.0 %  Comprehensive metabolic panel (Cmet) - [facility]  Result Value Ref Range   Sodium 141 136 - 145 mEq/L   Potassium 4.1 3.5 - 5.1 mEq/L   Chloride 104 98 - 109 mEq/L   CO2 27 22 - 29 mEq/L   Glucose 86 70 - 140 mg/dl   BUN 16.4 7.0 - 26.0 mg/dL   Creatinine 0.8 0.6 - 1.1 mg/dL   Total Bilirubin 0.46 0.20 - 1.20 mg/dL   Alkaline Phosphatase 72 40 - 150 U/L   AST 24 5 - 34 U/L   ALT 40 0 -  55 U/L   Total Protein 6.7 6.4 - 8.3 g/dL   Albumin 3.8 3.5 - 5.0 g/dL   Calcium 9.6 8.4 - 10.4 mg/dL   Anion Gap 10 3 - 11 mEq/L   EGFR 87 (L) >90 ml/min/1.73 m2     Studies/Results:  No results found.  Medications: I have reviewed the patient's current medications. Increase senokot S to 2 bid. Prescriptions for decadron 20 mg with food 12 hrs and 6 hrs prior to taxol, lorazepam 1 mg, instructions for zofran two of the 4 mg tablets = 8 mg q 8 hrs prn nausea after chemo. Prescriptions for tramadol #20 and oxycodone 5 mg 1/2 - 1 q 6 hr prn #20.  Ok to refill Lorrin Mais if needed, but should not use this + ativan at hs.  DISCUSSION:  Carboplatin and taxol q 3 week vs dose dense discussed. Reviewed possible chemotherapy side effects including allergic reaction to taxol, taxol aches, decrease in blood counts, nausea, constipation, peripheral neuropathy, hair loss.  Discussed timing of upcoming consultation at [facility], which will be 6 weeks  out from surgery. They understand that best use of second opinion consultations can be for options prior to start of treatment, however otherwise she is appropriate to begin adjuvant chemotherapy prior to that time, which may be more beneficial than further delay. As [place] has told patient that they are in agreement with beginning adjuvant therapy prior to their visit, this seems most appropriate and patient/ significant other are in full agreement. Medications as above. Again encouraged her to minimize pain medication as this is slowing bowels and likely increasing discomfort.  She is out of work until Aug 2, but may need extension. She should contact her Human Resources if we need to submit additional forms.   Earliest start for chemo due to preauthorization and with holiday next week will be July 8. She wants to consider q 3 week vs weekly, however likely she will try the q 3 week initially, and I expect she will be fine with that.     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 Second opinion consultation at [facility] 09-14-14. Needs to decrease pain medication and increase laxatives. 2.up to date on mammograms, previous benign breast biopsy 3.unremarkable colonoscopy spring 2016, next recommended 1 year. 4.peripheral venous access appears adequate for chemotherapy    Chemo orders entered, as well as granix orders for preauthorization. Verbal consent obtained. All questions answered. They know that they can call at any time if concerns or questions prior to scheduled appointments. Time spent 30 min including >50% counseling and coordination of care   Costella Schwarz P, MD   08/24/2014, 4:31 PM

## 2014-08-27 ENCOUNTER — Other Ambulatory Visit: Payer: Self-pay | Admitting: Oncology

## 2014-08-27 DIAGNOSIS — C5701 Malignant neoplasm of right fallopian tube: Secondary | ICD-10-CM

## 2014-08-30 ENCOUNTER — Ambulatory Visit (HOSPITAL_BASED_OUTPATIENT_CLINIC_OR_DEPARTMENT_OTHER): Payer: 59 | Admitting: Oncology

## 2014-08-30 ENCOUNTER — Encounter: Payer: Self-pay | Admitting: Oncology

## 2014-08-30 ENCOUNTER — Other Ambulatory Visit (HOSPITAL_BASED_OUTPATIENT_CLINIC_OR_DEPARTMENT_OTHER): Payer: 59

## 2014-08-30 ENCOUNTER — Other Ambulatory Visit: Payer: Self-pay | Admitting: Oncology

## 2014-08-30 ENCOUNTER — Telehealth: Payer: Self-pay | Admitting: Oncology

## 2014-08-30 VITALS — BP 151/82 | HR 102 | Temp 98.9°F | Resp 18 | Ht 63.0 in | Wt 178.1 lb

## 2014-08-30 DIAGNOSIS — K5909 Other constipation: Secondary | ICD-10-CM | POA: Diagnosis not present

## 2014-08-30 DIAGNOSIS — C5701 Malignant neoplasm of right fallopian tube: Secondary | ICD-10-CM

## 2014-08-30 DIAGNOSIS — R109 Unspecified abdominal pain: Secondary | ICD-10-CM

## 2014-08-30 DIAGNOSIS — K5904 Chronic idiopathic constipation: Secondary | ICD-10-CM

## 2014-08-30 LAB — CBC WITH DIFFERENTIAL/PLATELET
BASO%: 0.3 % (ref 0.0–2.0)
BASOS ABS: 0 10*3/uL (ref 0.0–0.1)
EOS ABS: 0.4 10*3/uL (ref 0.0–0.5)
EOS%: 4.8 % (ref 0.0–7.0)
HEMATOCRIT: 37.2 % (ref 34.8–46.6)
HEMOGLOBIN: 12 g/dL (ref 11.6–15.9)
LYMPH%: 18.1 % (ref 14.0–49.7)
MCH: 27.6 pg (ref 25.1–34.0)
MCHC: 32.3 g/dL (ref 31.5–36.0)
MCV: 85.5 fL (ref 79.5–101.0)
MONO#: 0.4 10*3/uL (ref 0.1–0.9)
MONO%: 5.6 % (ref 0.0–14.0)
NEUT%: 71.2 % (ref 38.4–76.8)
NEUTROS ABS: 5.6 10*3/uL (ref 1.5–6.5)
Platelets: 244 10*3/uL (ref 145–400)
RBC: 4.35 10*6/uL (ref 3.70–5.45)
RDW: 13.6 % (ref 11.2–14.5)
WBC: 7.9 10*3/uL (ref 3.9–10.3)
lymph#: 1.4 10*3/uL (ref 0.9–3.3)

## 2014-08-30 NOTE — Telephone Encounter (Signed)
Appointments made and avs printed for patient °

## 2014-08-30 NOTE — Progress Notes (Signed)
OFFICE PROGRESS NOTE   August 30, 2014   [place], [place], [place] ([place]), [place] Consultation Dr Domingo Dimes [facility] 09-14-14.  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 She is less uncomfortable now than last week.  Bowels are now moving 1-2x daily with Senokot S two tabs twice daily. Abdominal discomfort is tolerable with tramadol at hs, which allows her to sleep (has not tried ativan and does not have [place] now). She is more mobile at home. She completed lovenox on 08-28-14, no bleeding and no LE swelling. She is eating well and drinking fluids.   No PAC Genetics counseling planned 09-17-14. CA 125 not done pre op, was 180 on 08-16-14 (surgery 07-31-14)  She has second opinion consultation at [facility] scheduled 09-14-14, everyone aware that this is after start of chemo.   ONCOLOGIC HISTORY Patient saw PCP Dr Zadie Rhine with complaint of postcoital spotting and increased watery vaginal discharge, as well as increased constipation and gas. PAP 05-16-14 ([id number]) by Dr Radene Ou had adenocarcinoma. High risk HPV was negative. She was referred to Dr Christophe Louis, with pipelle endometrial sampling 05-29-14 suspicious for adenocarcinoma. Dr Denman George did colposcopy with biopsy and endocervical curretting on 06-11-14, with no malignancy identified ([id number]). She had MRI pelvis 06-18-14, with no definite endometrial or cervical mass and adnexa not remarkable, small pelvic ascites and no pelvic adenopathy. 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 She was offered surgery in May, but preferred to have this in June. Procedure was laparoscopy converted to laparotomy by Dr Denman George on 07-31-2014, with TAH, BSO, omentectomy and radical  tumor debulking with was optimal cytoreduction (R1) with <1 cm3 disease remaining on hepatic capsule and posterior cul de sac at completion. Intraoperatively there was large omental cake replacing entire omentum from greater curvature to infracolic omentum and from hepatic flexure to splenic flexure, 1000cc ascites, 12 wk size uterus with grossly normal cervix, ovaries and tubes replaced by tumor and <1 cm nodules on hepatic capsule. 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 Postoperative course was uncomplicated and she was discharged home on POD 3, on prophylactic lovenox. She saw Dr Denman George on 08-08-14 with discussion of pathology and discussion of IV vs IP chemotherapy. CT CAP was done on 08-14-14, showing small bilateral pleural effusions, peritoneal nodularity in greater omentum and gastrosplenic ligament, area at falciform ligament questionably subcapsular met, small common ilita nodes, thickening of underside of right rectus abdominus questionably post operative.     Review of systems as above, also: No fever or symptoms of infection. Not SOB with present activity level. Minimal vaginal spotting, intermittent. Remainder of 10 point Review of Systems negative.  Objective:  Vital signs in last 24 hours:  BP 151/82 mmHg  Pulse 102  Temp(Src) 98.9 F (37.2 C) (Oral)  Resp 18  Ht 5' 3"  (1.6 m)  Wt 178 lb 1.6 oz (80.786 kg)  BMI 31.56 kg/m2  SpO2 98%  LMP 07/08/2011 Weight down 2 lbs. Alert, oriented and appropriate. Ambulatory without assistance.  No alopecia  HEENT:PERRL, sclerae not icteric. Oral mucosa moist without lesions, posterior pharynx clear.  Neck supple. No JVD.  Lymphatics:no cervical,supraclavicular adenopathy Resp:  clear to auscultation bilaterally and normal percussion bilaterally Cardio: regular rate and rhythm. No  gallop. GI: soft, nontender, not distended, no mass or organomegaly. Normally active bowel sounds. Surgical incision entirely closed, no erythema or tenderness.  Musculoskeletal/ Extremities: without pitting edema, cords, tenderness Neuro: no peripheral neuropathy. Otherwise nonfocal. PSYCH appropriate mood and affect Skin without rash, ecchymosis, petechiae   Lab Results:  Results for orders placed or performed in visit on 08/30/14  CBC with Differential  Result Value Ref Range   WBC 7.9 3.9 - 10.3 10e3/uL   NEUT# 5.6 1.5 - 6.5 10e3/uL   HGB 12.0 11.6 - 15.9 g/dL   HCT 37.2 34.8 - 46.6 %   Platelets 244 145 - 400 10e3/uL   MCV 85.5 79.5 - 101.0 fL   MCH 27.6 25.1 - 34.0 pg   MCHC 32.3 31.5 - 36.0 g/dL   RBC 4.35 3.70 - 5.45 10e6/uL   RDW 13.6 11.2 - 14.5 %   lymph# 1.4 0.9 - 3.3 10e3/uL   MONO# 0.4 0.1 - 0.9 10e3/uL   Eosinophils Absolute 0.4 0.0 - 0.5 10e3/uL   Basophils Absolute 0.0 0.0 - 0.1 10e3/uL   NEUT% 71.2 38.4 - 76.8 %   LYMPH% 18.1 14.0 - 49.7 %   MONO% 5.6 0.0 - 14.0 %   EOS% 4.8 0.0 - 7.0 %   BASO% 0.3 0.0 - 2.0 %   CMET normal including creatinine 0.8 and T bili 0.46, with T prot 6.7 and alb 3.8  Studies/Results:  No results found.  Medications: I have reviewed the patient's current medications. She understands all instructions for the premed decadron for taxol   DISCUSSION: gyn oncology suggests trying heating pad for mild abdominal discomfort. She and significant other are comfortable with all plans and directions for chemotherapy. I will see her with labs a week after first treatment, then either on 7-25 (day of gyn onc and genetics) or could move my visit to 7-26.   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 Adjuvant chemotherapy to begin with q 3 week taxol carboplatin on 08-31-14. Follow up as  above. [facility] consultation set up by patient, first available 09-14-15 (Dr Domingo Dimes) 2.up to date on mammograms, previous benign breast biopsy 3.unremarkable colonoscopy spring 2016 4.peripheral venous access appears adequate for chemotherapy 5.constipation and abdominal discomfort improving, appears able to begin chemo now.  All questions answered. Chemo orders confirmed. Patient knows to call if any questions or concerns prior to next scheduled visit. Time spent 25 min including >50% counseling and coordination of care.    LIVESAY,LENNIS P, MD   08/30/2014, 4:27 PM

## 2014-08-31 ENCOUNTER — Ambulatory Visit (HOSPITAL_BASED_OUTPATIENT_CLINIC_OR_DEPARTMENT_OTHER): Payer: 59

## 2014-08-31 VITALS — BP 130/80 | HR 99 | Temp 99.3°F | Resp 18

## 2014-08-31 DIAGNOSIS — C5701 Malignant neoplasm of right fallopian tube: Secondary | ICD-10-CM

## 2014-08-31 DIAGNOSIS — Z5111 Encounter for antineoplastic chemotherapy: Secondary | ICD-10-CM

## 2014-08-31 MED ORDER — DIPHENHYDRAMINE HCL 50 MG/ML IJ SOLN
INTRAMUSCULAR | Status: AC
  Filled 2014-08-31: qty 1

## 2014-08-31 MED ORDER — FAMOTIDINE IN NACL 20-0.9 MG/50ML-% IV SOLN
20.0000 mg | Freq: Once | INTRAVENOUS | Status: AC
  Administered 2014-08-31: 20 mg via INTRAVENOUS

## 2014-08-31 MED ORDER — DEXAMETHASONE SODIUM PHOSPHATE 100 MG/10ML IJ SOLN
Freq: Once | INTRAMUSCULAR | Status: AC
  Administered 2014-08-31: 11:00:00 via INTRAVENOUS
  Filled 2014-08-31: qty 8

## 2014-08-31 MED ORDER — PACLITAXEL CHEMO INJECTION 300 MG/50ML
175.0000 mg/m2 | Freq: Once | INTRAVENOUS | Status: AC
  Administered 2014-08-31: 336 mg via INTRAVENOUS
  Filled 2014-08-31: qty 56

## 2014-08-31 MED ORDER — DIPHENHYDRAMINE HCL 50 MG/ML IJ SOLN
50.0000 mg | Freq: Once | INTRAMUSCULAR | Status: AC
  Administered 2014-08-31: 50 mg via INTRAVENOUS

## 2014-08-31 MED ORDER — SODIUM CHLORIDE 0.9 % IV SOLN
661.5000 mg | Freq: Once | INTRAVENOUS | Status: AC
  Administered 2014-08-31: 660 mg via INTRAVENOUS
  Filled 2014-08-31: qty 66

## 2014-08-31 MED ORDER — SODIUM CHLORIDE 0.9 % IV SOLN
Freq: Once | INTRAVENOUS | Status: AC
  Administered 2014-08-31: 11:00:00 via INTRAVENOUS

## 2014-08-31 MED ORDER — FAMOTIDINE IN NACL 20-0.9 MG/50ML-% IV SOLN
INTRAVENOUS | Status: AC
  Filled 2014-08-31: qty 50

## 2014-08-31 NOTE — Patient Instructions (Signed)
[place] [facility] Discharge Instructions for Patients Receiving Chemotherapy  Today you received the following chemotherapy agents taxol and carboplatin.  To help prevent nausea and vomiting after your treatment, we encourage you to take your nausea medication zofran and ativan. Please take a zofran 4mg  before bed tonight and then one in the morning.  Then use as directed as needed.  If you have nausea that it not relieved by the zofran, then use the ativan (lorazepam) as directed.  Please be aware that the ativan can make you very sleepy.   If you develop nausea and vomiting that is not controlled by your nausea medication, call the clinic.   BELOW ARE SYMPTOMS THAT SHOULD BE REPORTED IMMEDIATELY:  *FEVER GREATER THAN 100.5 F  *CHILLS WITH OR WITHOUT FEVER  NAUSEA AND VOMITING THAT IS NOT CONTROLLED WITH YOUR NAUSEA MEDICATION  *UNUSUAL SHORTNESS OF BREATH  *UNUSUAL BRUISING OR BLEEDING  TENDERNESS IN MOUTH AND THROAT WITH OR WITHOUT PRESENCE OF ULCERS  *URINARY PROBLEMS  *BOWEL PROBLEMS  UNUSUAL RASH Items with * indicate a potential emergency and should be followed up as soon as possible.  Feel free to call the clinic you have any questions or concerns. The clinic phone number is [phone].  Please show the [facility] at check-in to the Emergency Department and triage nurse.

## 2014-09-03 ENCOUNTER — Telehealth: Payer: Self-pay | Admitting: *Deleted

## 2014-09-03 NOTE — Telephone Encounter (Signed)
-----   Message from Gordy Levan, MD sent at 09/01/2014  9:08 PM EDT ----- Had first taxol carbo on 08-31-14. Please check on her on 7-11 by phone.  She is to see Dr Domingo Dimes at [facility] on 09-14-14, new patient consultation set up by patient. Please ask that office if they need any additional records etc sent for the appointment.   thanks

## 2014-09-03 NOTE — Telephone Encounter (Signed)
Pt states she is doing OK. States she is "aching" using motrin and claritin. Taking fluids without problems, does not feel like eating much. Slight nausea, no vomiting or diarrhea. Reinforced use of antiemetics.   RN spoke with [facility]- they have everything they need from [place]

## 2014-09-05 ENCOUNTER — Other Ambulatory Visit: Payer: Self-pay | Admitting: Oncology

## 2014-09-06 ENCOUNTER — Other Ambulatory Visit: Payer: 59

## 2014-09-06 ENCOUNTER — Telehealth: Payer: Self-pay | Admitting: Nurse Practitioner

## 2014-09-06 ENCOUNTER — Telehealth: Payer: Self-pay | Admitting: Oncology

## 2014-09-06 ENCOUNTER — Ambulatory Visit: Payer: 59 | Admitting: Oncology

## 2014-09-06 ENCOUNTER — Other Ambulatory Visit: Payer: Self-pay | Admitting: Oncology

## 2014-09-06 ENCOUNTER — Other Ambulatory Visit (HOSPITAL_BASED_OUTPATIENT_CLINIC_OR_DEPARTMENT_OTHER): Payer: 59

## 2014-09-06 DIAGNOSIS — C5701 Malignant neoplasm of right fallopian tube: Secondary | ICD-10-CM | POA: Diagnosis not present

## 2014-09-06 LAB — CBC WITH DIFFERENTIAL/PLATELET
BASO%: 0.4 % (ref 0.0–2.0)
Basophils Absolute: 0 10*3/uL (ref 0.0–0.1)
EOS%: 4.2 % (ref 0.0–7.0)
Eosinophils Absolute: 0.3 10*3/uL (ref 0.0–0.5)
HCT: 38.6 % (ref 34.8–46.6)
HGB: 12.5 g/dL (ref 11.6–15.9)
LYMPH#: 1.2 10*3/uL (ref 0.9–3.3)
LYMPH%: 19.5 % (ref 14.0–49.7)
MCH: 26.8 pg (ref 25.1–34.0)
MCHC: 32.4 g/dL (ref 31.5–36.0)
MCV: 82.6 fL (ref 79.5–101.0)
MONO#: 0.1 10*3/uL (ref 0.1–0.9)
MONO%: 1.7 % (ref 0.0–14.0)
NEUT#: 4.5 10*3/uL (ref 1.5–6.5)
NEUT%: 74.2 % (ref 38.4–76.8)
PLATELETS: 303 10*3/uL (ref 145–400)
RBC: 4.68 10*6/uL (ref 3.70–5.45)
RDW: 13.6 % (ref 11.2–14.5)
WBC: 6.1 10*3/uL (ref 3.9–10.3)

## 2014-09-06 LAB — COMPREHENSIVE METABOLIC PANEL (CC13)
ALBUMIN: 3.9 g/dL (ref 3.5–5.0)
ALK PHOS: 77 U/L (ref 40–150)
ALT: 69 U/L — ABNORMAL HIGH (ref 0–55)
ANION GAP: 11 meq/L (ref 3–11)
AST: 30 U/L (ref 5–34)
BUN: 17.2 mg/dL (ref 7.0–26.0)
CALCIUM: 9.8 mg/dL (ref 8.4–10.4)
CHLORIDE: 101 meq/L (ref 98–109)
CO2: 28 meq/L (ref 22–29)
Creatinine: 0.9 mg/dL (ref 0.6–1.1)
EGFR: 75 mL/min/{1.73_m2} — AB (ref >90)
Glucose: 97 mg/dl (ref 70–140)
POTASSIUM: 3.8 meq/L (ref 3.5–5.1)
Sodium: 140 mEq/L (ref 136–145)
TOTAL PROTEIN: 7.1 g/dL (ref 6.4–8.3)
Total Bilirubin: 0.62 mg/dL (ref 0.20–1.20)

## 2014-09-06 NOTE — Telephone Encounter (Signed)
lvm for pt regarding to r/s appt on 7.15 and 7.18

## 2014-09-06 NOTE — Telephone Encounter (Signed)
Per MD, Rn called to check on patient due to missed MD apt today. She states she feels "better since the chemo" and reports mild nausea requiring medication x1 today. No emesis; denies fever or flu-like symptoms.  Patient reports change in bowel habit; 2 episodes of soft stool although "not diarrhea" and she has not noticed blood. Patient is able to eat and drink well and does not believe to be dehydrated. Patient has been taking laxatives, suggested to hold these for now until MD apt with Dr. Marko Plume on 09/10/14. She is advised to drink plenty of water and notify clinic if no BM x2 days or feels dehydrated.  Per MD POF, patient to come for CBC and Cmet today and lab/MD visit on 7/18. Patient verbalizes understanding of the above and is aware of new apts.

## 2014-09-06 NOTE — Telephone Encounter (Signed)
Per Dr. Marko Plume, patient informed today's CBC and Cmet results are normal. We will recheck again Monday. Patient informed to call [phone] with any needs even if after hours. She verbalizes understanding and thanks for the call.

## 2014-09-07 ENCOUNTER — Other Ambulatory Visit: Payer: 59

## 2014-09-07 ENCOUNTER — Other Ambulatory Visit: Payer: Self-pay | Admitting: *Deleted

## 2014-09-07 DIAGNOSIS — C5700 Malignant neoplasm of unspecified fallopian tube: Secondary | ICD-10-CM

## 2014-09-10 ENCOUNTER — Telehealth: Payer: Self-pay | Admitting: Oncology

## 2014-09-10 ENCOUNTER — Ambulatory Visit (HOSPITAL_BASED_OUTPATIENT_CLINIC_OR_DEPARTMENT_OTHER): Payer: 59 | Admitting: Oncology

## 2014-09-10 ENCOUNTER — Encounter: Payer: Self-pay | Admitting: Oncology

## 2014-09-10 ENCOUNTER — Other Ambulatory Visit (HOSPITAL_BASED_OUTPATIENT_CLINIC_OR_DEPARTMENT_OTHER): Payer: 59

## 2014-09-10 VITALS — BP 136/87 | HR 113 | Temp 98.7°F | Resp 18 | Ht 63.0 in | Wt 177.8 lb

## 2014-09-10 DIAGNOSIS — C5701 Malignant neoplasm of right fallopian tube: Secondary | ICD-10-CM

## 2014-09-10 DIAGNOSIS — T451X5A Adverse effect of antineoplastic and immunosuppressive drugs, initial encounter: Secondary | ICD-10-CM

## 2014-09-10 DIAGNOSIS — D701 Agranulocytosis secondary to cancer chemotherapy: Secondary | ICD-10-CM

## 2014-09-10 DIAGNOSIS — C5700 Malignant neoplasm of unspecified fallopian tube: Secondary | ICD-10-CM

## 2014-09-10 LAB — COMPREHENSIVE METABOLIC PANEL (CC13)
ALK PHOS: 70 U/L (ref 40–150)
ALT: 39 U/L (ref 0–55)
AST: 15 U/L (ref 5–34)
Albumin: 3.6 g/dL (ref 3.5–5.0)
Anion Gap: 8 mEq/L (ref 3–11)
BUN: 15.1 mg/dL (ref 7.0–26.0)
CO2: 28 mEq/L (ref 22–29)
Calcium: 9.4 mg/dL (ref 8.4–10.4)
Chloride: 107 mEq/L (ref 98–109)
Creatinine: 0.7 mg/dL (ref 0.6–1.1)
EGFR: >90 mL/min/{1.73_m2} (ref >90)
Glucose: 87 mg/dl (ref 70–140)
Potassium: 3.6 mEq/L (ref 3.5–5.1)
SODIUM: 143 meq/L (ref 136–145)
TOTAL PROTEIN: 6.5 g/dL (ref 6.4–8.3)
Total Bilirubin: 0.33 mg/dL (ref 0.20–1.20)

## 2014-09-10 LAB — CBC WITH DIFFERENTIAL/PLATELET
BASO%: 0.9 % (ref 0.0–2.0)
Basophils Absolute: 0 10*3/uL (ref 0.0–0.1)
EOS ABS: 0.1 10*3/uL (ref 0.0–0.5)
EOS%: 5.4 % (ref 0.0–7.0)
HCT: 33.1 % — ABNORMAL LOW (ref 34.8–46.6)
HGB: 10.8 g/dL — ABNORMAL LOW (ref 11.6–15.9)
LYMPH#: 1 10*3/uL (ref 0.9–3.3)
LYMPH%: 48.5 % (ref 14.0–49.7)
MCH: 26.9 pg (ref 25.1–34.0)
MCHC: 32.7 g/dL (ref 31.5–36.0)
MCV: 82.1 fL (ref 79.5–101.0)
MONO#: 0.4 10*3/uL (ref 0.1–0.9)
MONO%: 17.7 % — ABNORMAL HIGH (ref 0.0–14.0)
NEUT#: 0.6 10*3/uL — ABNORMAL LOW (ref 1.5–6.5)
NEUT%: 27.5 % — AB (ref 38.4–76.8)
Platelets: 250 10*3/uL (ref 145–400)
RBC: 4.02 10*6/uL (ref 3.70–5.45)
RDW: 13.4 % (ref 11.2–14.5)
WBC: 2 10*3/uL — ABNORMAL LOW (ref 3.9–10.3)

## 2014-09-10 MED ORDER — TBO-FILGRASTIM 480 MCG/0.8ML ~~LOC~~ SOSY
480.0000 ug | PREFILLED_SYRINGE | Freq: Once | SUBCUTANEOUS | Status: AC
  Administered 2014-09-10: 480 ug via SUBCUTANEOUS
  Filled 2014-09-10: qty 0.8

## 2014-09-10 NOTE — Patient Instructions (Signed)
Neutropenia Neutropenia is a condition that occurs when the level of a certain type of white blood cell (neutrophil) in your body becomes lower than normal. Neutrophils are made in the bone marrow and fight infections. These cells protect against bacteria and viruses. The fewer neutrophils you have, and the longer your body remains without them, the greater your risk of getting a severe infection becomes. CAUSES  The cause of neutropenia may be hard to determine. However, it is usually due to 3 main problems:   Decreased production of neutrophils. This may be due to:  Certain medicines such as chemotherapy.  Genetic problems.  Cancer.  Radiation treatments.  Vitamin deficiency.  Some pesticides.  Increased destruction of neutrophils. This may be due to:  Overwhelming infections.  Hemolytic anemia. This is when the body destroys its own blood cells.  Chemotherapy.  Neutrophils moving to areas of the body where they cannot fight infections. This may be due to:  Dialysis procedures.  Conditions where the spleen becomes enlarged. Neutrophils are held in the spleen and are not available to the rest of the body.  Overwhelming infections. The neutrophils are held in the area of the infection and are not available to the rest of the body. SYMPTOMS  There are no specific symptoms of neutropenia. The lack of neutrophils can result in an infection, and an infection can cause various problems. DIAGNOSIS  Diagnosis is made by a blood test. A complete blood count is performed. The normal level of neutrophils in human blood differs with age and race. Infants have lower counts than older children and adults. African Americans have lower counts than Caucasians or Asians. The average adult level is 1500 cells/mm3 of blood. Neutrophil counts are interpreted as follows:  Greater than 1000 cells/mm3 gives normal protection against infection.  500 to 1000 cells/mm3 gives an increased risk for  infection.  200 to 500 cells/mm3 is a greater risk for severe infection.  Lower than 200 cells/mm3 is a marked risk of infection. This may require hospitalization and treatment with antibiotic medicines. TREATMENT  Treatment depends on the underlying cause, severity, and presence of infections or symptoms. It also depends on your health. Your caregiver will discuss the treatment plan with you. Mild cases are often easily treated and have a good outcome. Preventative measures may also be started to limit your risk of infections. Treatment can include:  Taking antibiotics.  Stopping medicines that are known to cause neutropenia.  Correcting nutritional deficiencies by eating green vegetables to supply folic acid and taking vitamin B supplements.  Stopping exposure to pesticides if your neutropenia is related to pesticide exposure.  Taking a blood growth factor called sargramostim, pegfilgrastim, or filgrastim if you are undergoing chemotherapy for cancer. This stimulates white blood cell production.  Removal of the spleen if you have Felty's syndrome and have repeated infections. HOME CARE INSTRUCTIONS   Follow your caregiver's instructions about when you need to have blood work done.  Wash your hands often. Make sure others who come in contact with you also wash their hands.  Wash raw fruits and vegetables before eating them. They can carry bacteria and fungi.  Avoid people with colds or spreadable (contagious) diseases (chickenpox, herpes zoster, influenza).  Avoid large crowds.  Avoid construction areas. The dust can release fungus into the air.  Be cautious around children in daycare or school environments.  Take care of your respiratory system by coughing and deep breathing.  Bathe daily.  Protect your skin from cuts and   burns.  Do not work in the garden or with flowers and plants.  Care for the mouth before and after meals by brushing with a soft toothbrush. If you have  mucositis, do not use mouthwash. Mouthwash contains alcohol and can dry out the mouth even more.  Clean the area between the genitals and the anus (perineal area) after urination and bowel movements. Women need to wipe from front to back.  Use a water soluble lubricant during sexual intercourse and practice good hygiene after. Do not have intercourse if you are severely neutropenic. Check with your caregiver for guidelines.  Exercise daily as tolerated.  Avoid people who were vaccinated with a live vaccine in the past 30 days. You should not receive live vaccines (polio, typhoid).  Do not provide direct care for pets. Avoid animal droppings. Do not clean litter boxes and bird cages.  Do not share food utensils.  Do not use tampons, enemas, or rectal suppositories unless directed by your caregiver.  Use an electric razor to remove hair.  Wash your hands after handling magazines, letters, and newspapers. SEEK IMMEDIATE MEDICAL CARE IF:   You have a fever.  You have chills or start to shake.  You feel nauseous or vomit.  You develop mouth sores.  You develop aches and pains.  You have redness and swelling around open wounds.  Your skin is warm to the touch.  You have pus coming from your wounds.  You develop swollen lymph nodes.  You feel weak or fatigued.  You develop red streaks on the skin. MAKE SURE YOU:  Understand these instructions.  Will watch your condition.  Will get help right away if you are not doing well or get worse. Document Released: 08/01/2001 Document Revised: 05/04/2011 Document Reviewed: 08/29/2010 [facility] Patient Information 2015 [place], [place]. This information is not intended to replace advice given to you by your health care provider. Make sure you discuss any questions you have with your health care provider.   Food Safety for the Immunocompromised Person Food safety is important for people who are immunocompromised. Immunocompromised  means that the immune system is impaired or weakened (as by drugs or illness). This diet is often recommended before and after certain cancer treatments and after an organ or bone marrow transplant. It is important to follow these food safety guidelines for as long as your dietitian or caregiver instructs you to. These food safety guidelines are sometimes called the neutropenic diet or low-microbial diet. Bacteria and other harmful microorganisms are more likely to be present in raw or fresh foods. Thoroughly cooking foods destroys these microorganisms. For example, fresh vegetables should be cooked until tender; meats should be cooked until well-done; and eggs should be cooked until the yolks are firm. Also, certain food products are treated with a method known as pasteurization. Pasteurization briefly exposes food to high heat that kills any bacteria. Look for dairy products, juices, and ciders that have the word "pasteurized" on the label.  GENERAL GUIDELINES  Check expiration dates on all products before you buy them. Nothing you buy should be past the expiration date.  Wash the following items with soap and hot water before and after touching food:  Countertops.  Cutting boards (wash these in a dishwasher if you have one).  All cooking utensils.  All silverware.  All pots and pans.  Before preparing food, wash your hands frequently with warm, soapy water and dry your hands with paper towels. This is especially important after touching raw meat,  eggs, and fish.  Wash dishes in hot, soapy water or in a dishwasher. Air dry dishes. Do not use a cloth towel.  Keep perishable food very hot or very cold. Do not leave perishable items at room temperature for more than 10-15 minutes.  All perishable foods should be cooked thoroughly. No rare meat should be eaten.  Wash fruits and vegetables thoroughly under cold running water before peeling or cutting. Individually scrub produce that has a  thick, rough skin or rind, such as lettuce, spinach, or cabbage. Do not use commercial rinses to wash fruits and vegetables.   Packaged salads, slaw mix, and other prepared produce (even marked "prewashed") should be rinsed again under cold running water.   If consuming unpasteurized or fresh tofu, cut tofu into 1 inch (or smaller) cubes. Then, boil the tofu for at least 5 minutes in water or broth before eating or using the tofu in recipes.  Use distilled or bottled water if you are using a water service other than the city water service.  Thaw frozen foods in the refrigerator overnight or quickly in the microwave. Do not thaw food on the countertop.  Refrigerate leftovers promptly in airtight containers.  Use leftovers only if they have been stored properly and have been around for no more than 24 hours.  When food shopping, avoid salad bars, bulk food bins, food samples, and snacks that are out in the open.  When dining out:  Avoid salad bars, delis, and buffets.  Use single-serve condiments, such as ketchup, mustard, mayonnaise, soy sauce, steak sauce, salt, pepper, and sugar. Check with your caregiver after blood work is done to see when your neutropenic diet can be modified with fewer restrictions. SPECIFIC EXAMPLE GUIDELINES Beverages  Allowed: Boiled well water. Bottled spring, distilled, and natural water. Tap water and ice made from bottled or tap water. All canned, bottled, powdered beverages. Instant and brewed coffee, tea; cold-brewed tea made with boiling water. Brewed herbal teas using commercially-packaged tea bags. Liquid and powdered commercial nutritional supplements. Other beverages not listed below.   Avoid: Unboiled well water.Cold-brewed tea made with warm or cold water. Raw, unpasteurized milk. Unpasteurized fruit and vegetable juices. Mat tea. Eggnog or milkshakes made with raw eggs. Fresh apple cider.  Meat, Fish, Eggs, Poultry  Allowed: All  thoroughly-cooked or canned meats: beef, pork, lamb, poultry, fish, shellfish, game, ham, bacon, sausage, hot dogs. Thoroughly cooked pasteurized egg substitutes and eggs (egg white cooked firm with thickened yellow yolk acceptable). Commercially packaged salami, bologna, and other luncheon meats. Hot dogs should be heated until steaming (165F [73.9C]). Cooked tofu. Prepackaged peanut butter.  Avoid: Uncooked or rare meat, fish, eggs, or poultry. Commercially prepared meat and fish salads. Sushi. Raw or undercooked meat, poultry, fish, game, tofu. Raw or undercooked eggs and egg substitutes. Unheated meats and cold cuts from the deli. Hard cured salami in natural wrap. Cold-smoked salmon, lox. Pickled fish. [place] products. Dairy Products  Allowed: Pasteurized, grade "A" milk or milk products or lactose-free milk or yogurt. Pasteurized yogurt or frozen yogurt. Prepackaged ice cream, sherbet, ice cream bars, homemade milkshakes. Prepackaged and pasteurized hard cheeses, such as cheddar, [place], [place], or Swiss. Prepackaged soft cheeses, such as cottage cheese, cream cheese, or ricotta. Dry, refrigerated, and frozen pasteurized whipped topping. Commercial nutritional supplements. Pasteurized eggnog. Pasteurized sour cream.  Avoid: Soft-serve ice cream or frozen yogurt. Hand-packed ice cream or frozen yogurt. Feta, brie, camembert, blue, gorgonzola, Stilton, Roquefort, farmer's cheese, and queso fresco cheeses. Any imported  cheeses and any cheese sliced at a deli.Unpasteurized or raw milk cheese, yogurt, and other milk products. Cheeses containing chili peppers or other uncooked vegetables. Breads, Cereals, Rice, Potatoes, Pasta  Allowed: All prepackaged or homemade breads, bagels, rolls, pancakes, sweet rolls, waffles, [place] toast, muffins, cakes, donuts, cookies, crackers. All boxed hot or cold cereals. Cooked potatoes, rice, noodles, other grains. Potato chips, corn chips, tortilla chips,  pretzels, popcorn.  Avoid: Fresh bakery breads, muffins, cakes, donuts, cream or custard filled cakes. Raw grain products. Vegetables and Fruits  Allowed: All frozen, canned, and washed raw vegetables that have been cooked. All cooked or canned fruits. Raw, well-washed and non-bruised fruits. Pasteurized fruit juices. Canned and stewed fruit. Dried fruits.  Avoid: Unwashedraw vegetables and salads. All raw vegetable sprouts (alfalfa, radish, broccoli, mung bean, all others). Salads from the deli.Prepackaged salsas stored in refrigerated case. Unwashed raw fruits. Unpasteurized fruit and vegetable juices. Nuts  Allowed: Processed peanut butter. Canned or bottled roasted nuts. Nuts in baked products.  Avoid:Unroasted raw nuts. Unprocessed nuts. Roasted nuts in the shell. Condiments and Spices  Allowed: All cooked, fresh, or canned spices (add at least 5 minutes before cooking ends). Thoroughly washed fresh herbs and spices. Ketchup, mustard, BBQ sauce, soy sauce, and mayonnaise served in separate containers with clean utensils, refrigerated after opening. Sugar, jelly, and honey served from clean containers with clean utensils.   Avoid: Uncooked spices. Raw honey. Anything from a family container that is not freshly washed.  Desserts  Allowed:Refrigerated commercial and homemade cakes, pies, pastries, and pudding. Refrigerated cream-filled pastries. Homemade and commercial cookies. Shelf-stable cream-filled cupcakes, fruit pies, and canned pudding. Ices, popsicle-like products.  Avoid: Unrefrigerated, cream-filled pastry products (not shelf-stable). Fats  Allowed: Oil, shortening, refrigerated lard, margarine, butter. Commercial or shelf-stable mayonnaise and salad dressings (including cheese-based salad dressings, refrigerated after opening). Cooked gravy and sauces.  Avoid: Fresh salad dressings containing aged cheese (blue cheese, Roquefort) or raw eggs, stored in refrigerated  case. Restaurant Foods and Miscellaneous  Allowed:Thoroughly cooked frozen dinners. Thoroughly cooked frozen pizza. Canned entrees.   Avoid: Eating at restaurants while in neutropenia or using take-out deli food even if it is behind the counter. Avoid all salad bars while you are neutropenic. Avoid all self-serve buffets while you are neutropenic.  Ask your caregiver for information or recommendations regarding poor appetite and weight loss during cancer treatment if needed.  Document Released: 12/07/2006 Document Revised: 08/11/2011 Document Reviewed: 06/26/2011 [facility] Patient Information 2015 [place], [place]. This information is not intended to replace advice given to you by your health care provider. Make sure you discuss any questions you have with your health care provider.

## 2014-09-10 NOTE — Progress Notes (Signed)
OFFICE PROGRESS NOTE   September 10, 2014   [place], [place], [place] Luna ([place]), GI [facility] Consultation Dr Domingo Dimes [facility] 09-14-14.  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 She is neutropenic today, gCSF begun now. NOTE she has consultation with Dr Ulice Dash at [facility] on 09-14-14. She is to see Dr Denman George and has genetics counseling 09-17-14.  Patient missed MD appointment on 09-06-14, rescheduled for today. She did get labs on 09-06-14, all counts still in good range then. She denies fever or symptoms of infection now, tho temperatures at office at arrival to office today were 99.6 - 100.2.  Patient otherwise seems to have tolerated first chemotherapy adequately. She had taxol aches days 3-4-5, stayed mostly in bed, did try claritin and some motrin beginning day 3. She had decreased appetite for first several days, tho no frank nausea, did try antiemetics without real improvement in appetite; she was able to drink fluids well during all of that time. Bowels are moving well without laxatives now, x3 paste consistency today by 4 PM appointment; bowels move after each time that she eats. Abdomen is more comfortable since bowels moving better. She has not used tramadol since start of chemo. She is having difficulty sleeping despite ativan at hs, wakens ~ 0300 "uncomfortable" and hard to go back to sleep. More fatigued today. Bladder ok.   No PAC Genetics counseling planned 09-17-14. CA 125 not done pre op, was 180 on 08-16-14 (surgery 07-31-14)  ONCOLOGIC HISTORY Patient saw PCP Dr Zadie Rhine with complaint of postcoital spotting and increased watery vaginal discharge, as well as increased constipation and gas. PAP 05-16-14 ([id number]) by Dr Radene Ou had adenocarcinoma. High risk HPV was negative. She was  referred to Dr Christophe Louis, with pipelle endometrial sampling 05-29-14 suspicious for adenocarcinoma. Dr Denman George did colposcopy with biopsy and endocervical curretting on 06-11-14, with no malignancy identified ([id number]). She had MRI pelvis 06-18-14, with no definite endometrial or cervical mass and adnexa not remarkable, small pelvic ascites and no pelvic adenopathy. 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 She was offered surgery in May, but preferred to have this in June. Procedure was laparoscopy converted to laparotomy by Dr Denman George on 07-31-2014, with TAH, BSO, omentectomy and radical tumor debulking with was optimal cytoreduction (R1) with <1 cm3 disease remaining on hepatic capsule and posterior cul de sac at completion. Intraoperatively there was large omental cake replacing entire omentum from greater curvature to infracolic omentum and from hepatic flexure to splenic flexure, 1000cc ascites, 12 wk size uterus with grossly normal cervix, ovaries and tubes replaced by tumor and <1 cm nodules on hepatic capsule. 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 Postoperative course was uncomplicated and she was discharged home on POD 3, on prophylactic lovenox. She saw Dr Denman George on 08-08-14 with discussion of pathology and discussion of IV vs IP chemotherapy. CT CAP was done on 08-14-14, showing small bilateral pleural effusions, peritoneal nodularity in greater omentum and gastrosplenic ligament, area at falciform ligament questionably subcapsular met, small common ilita nodes, thickening of underside of right rectus abdominus questionably post operative   Review of systems as above, also: No problems at IV access site for chemo. No SOB or  cough. Surgical discomfort gradually improving, still difficult to sit.  Complains of intermittent "pulsating" on either side of chest at ~ anterior axillary fold areas, no other associated symptoms or features elicited.  Remainder of 10 point Review of Systems negative.  Objective:  Vital signs in last 24 hours: temps on arrival to office 99.6, 99.2, 100.2 Temp repeated at time of MD exam, 98.7 BP 136/87 mmHg  Pulse 113  Temp(Src) 98.7 F (37.1 C) (Oral)  Resp 18  Ht 5' 3"  (1.6 m)  Wt 177 lb 12.8 oz (80.65 kg)  BMI 31.50 kg/m2  LMP 07/08/2011 Tearful, concerned about low WBC.  Alert, oriented and appropriate. Ambulatory without assistance, more easily mobile tho still moving slowly.  No alopecia  HEENT:PERRL, sclerae not icteric. Oral mucosa moist without lesions, posterior pharynx clear.  Neck supple. No JVD.  Lymphatics:no cervical,supraclavicular adenopathy Resp: clear to auscultation bilaterally and normal percussion bilaterally Cardio: regular rate and rhythm. No gallop. GI: soft, nontender on gentle exam, not distended, no mass or organomegaly. A few bowel sounds, normal. Surgical incision closed, no erythema, steristrips still in place.  Musculoskeletal/ Extremities: without pitting edema, cords, tenderness UE or LE Neuro: no peripheral neuropathy. Otherwise nonfocal. PSYCH tearful initially, mood and affect improved by completion of visit Skin without rash, ecchymosis, petechiae other than small resolving ecchymosis from blood draw site left antecubital, with site of chemo IV RUE unremarkable   Lab Results:  Results for orders placed or performed in visit on 09/10/14  CBC with Differential  Result Value Ref Range   WBC 2.0 (L) 3.9 - 10.3 10e3/uL   NEUT# 0.6 (L) 1.5 - 6.5 10e3/uL   HGB 10.8 (L) 11.6 - 15.9 g/dL   HCT 33.1 (L) 34.8 - 46.6 %   Platelets 250 145 - 400 10e3/uL   MCV 82.1 79.5 - 101.0 fL   MCH 26.9 25.1 - 34.0 pg   MCHC 32.7 31.5 - 36.0 g/dL   RBC 4.02 3.70 - 5.45 10e6/uL   RDW 13.4 11.2 - 14.5 %   lymph# 1.0 0.9 - 3.3  10e3/uL   MONO# 0.4 0.1 - 0.9 10e3/uL   Eosinophils Absolute 0.1 0.0 - 0.5 10e3/uL   Basophils Absolute 0.0 0.0 - 0.1 10e3/uL   NEUT% 27.5 (L) 38.4 - 76.8 %   LYMPH% 48.5 14.0 - 49.7 %   MONO% 17.7 (H) 0.0 - 14.0 %   EOS% 5.4 0.0 - 7.0 %   BASO% 0.9 0.0 - 2.0 %  Comprehensive metabolic panel (Cmet) - [facility]  Result Value Ref Range   Sodium 143 136 - 145 mEq/L   Potassium 3.6 3.5 - 5.1 mEq/L   Chloride 107 98 - 109 mEq/L   CO2 28 22 - 29 mEq/L   Glucose 87 70 - 140 mg/dl   BUN 15.1 7.0 - 26.0 mg/dL   Creatinine 0.7 0.6 - 1.1 mg/dL   Total Bilirubin 0.33 0.20 - 1.20 mg/dL   Alkaline Phosphatase 70 40 - 150 U/L   AST 15 5 - 34 U/L   ALT 39 0 - 55 U/L   Total Protein 6.5 6.4 - 8.3 g/dL   Albumin 3.6 3.5 - 5.0 g/dL   Calcium 9.4 8.4 - 10.4 mg/dL   Anion Gap 8 3 - 11 mEq/L   EGFR >90 >90 ml/min/1.73 m2     Studies/Results:  No results found.  Medications: I have reviewed the patient's current medications. Begin granix 480 mcg today, 7-19 and 7-20.  WIll continue beyond 7-20  depending on counts 09-13-14.  She will resume claritin now, continue thru granix doses for possible gCSF aches.   DISCUSSION: all of interval history related to first chemo reviewed as above. Chemo neutropenia discussed, counts likely not yet at nadir, neutropenic precautions reviewed by MD and by RN with written information given. Mechanism of action of gCSF discussed, possible bone aches. Mentioned neulasta instead of granix after subsequent treatments, will decide based on aching and counts, NOTE neulasta will need preauth if that is used.  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 Adjuvant chemotherapy begun with q 3 week taxol carboplatin on 08-31-14. [facility] consultation set up by patient, first available 09-14-15 (Dr Domingo Dimes). She will see  Dr Denman George for post op follow up on 7-25, also genetics counselors and this MD + labs same day. Cycle 2 carbo taxol due 09-21-14.  2. Chemo neutropenia:  Begin granix now, follow up counts at least 7-21 with further granix then if indicated. WIll need gCSF with each subsequent treatment. Neutropenic precautions. Did not start prophylactic cipro due to [place] >0.5 today. 3.unremarkable colonoscopy spring 2016 4.peripheral venous access adequate for chemotherapy 5.constipation and abdominal discomfort improving since surgery 6.up to date on mammograms, previous benign breast biopsy   All questions answered. Patient and friend know to contact this office if concerns or if temp >=100.5 or symptoms of infection while ANC is low. Granix orders placed with instructions to give on 7-21 if ANC <1.5 and to ask MD for further orders if so. Time spent 30 min including >50% counseling and coordination of care.  Cc this note to Drs Benancio Deeds and [place]  Gordy Levan, MD   09/10/2014, 4:47 PM

## 2014-09-10 NOTE — Telephone Encounter (Signed)
Gave avs & calendar for July & August. Sent message to MD to confirm appointment for August

## 2014-09-11 ENCOUNTER — Telehealth: Payer: Self-pay | Admitting: *Deleted

## 2014-09-11 ENCOUNTER — Ambulatory Visit (HOSPITAL_BASED_OUTPATIENT_CLINIC_OR_DEPARTMENT_OTHER): Payer: 59

## 2014-09-11 ENCOUNTER — Other Ambulatory Visit: Payer: Self-pay | Admitting: Oncology

## 2014-09-11 VITALS — BP 129/84 | HR 96 | Temp 99.9°F

## 2014-09-11 DIAGNOSIS — Z5189 Encounter for other specified aftercare: Secondary | ICD-10-CM

## 2014-09-11 DIAGNOSIS — C5701 Malignant neoplasm of right fallopian tube: Secondary | ICD-10-CM | POA: Diagnosis not present

## 2014-09-11 DIAGNOSIS — D701 Agranulocytosis secondary to cancer chemotherapy: Secondary | ICD-10-CM | POA: Insufficient documentation

## 2014-09-11 DIAGNOSIS — T451X5A Adverse effect of antineoplastic and immunosuppressive drugs, initial encounter: Secondary | ICD-10-CM

## 2014-09-11 MED ORDER — TBO-FILGRASTIM 480 MCG/0.8ML ~~LOC~~ SOSY
480.0000 ug | PREFILLED_SYRINGE | Freq: Once | SUBCUTANEOUS | Status: AC
  Administered 2014-09-11: 480 ug via SUBCUTANEOUS
  Filled 2014-09-11: qty 0.8

## 2014-09-11 NOTE — Progress Notes (Signed)
Addendum to office note 09-10-14  Patient instructed to try tramadol at hs tonight. If not able to sleep even with this would refill Ambien.  Godfrey Pick, MD

## 2014-09-11 NOTE — Telephone Encounter (Signed)
MD note and labs faxed to Dr. Ulice Dash as noted below by Dr. Marko Plume.

## 2014-09-11 NOTE — Telephone Encounter (Signed)
-----   Message from Gordy Levan, MD sent at 09/11/2014 11:55 AM EDT ----- Please fax my note from 7-18 + labs that day to Dr Domingo Dimes at [facility], who is to see her in consultation on 09-14-14   thanks

## 2014-09-11 NOTE — Telephone Encounter (Signed)
-----   Message from Gordy Levan, MD sent at 09/11/2014  7:22 AM EDT ----- Labs seen and need follow up: for #2 granix @ 4:15 today. Could you please check on her by phone earlier in day or speak to her just to check when she comes?  Thank you

## 2014-09-11 NOTE — Telephone Encounter (Signed)
Called patient as noted below by Dr. Marko Plume. Patient states she took Claritin when she got home yesterday afternoon and is feeling fine today. Patient states she does have a slight headache today but she took advil and the headache is easing up now. Patient states she took her temperature this morning and it was 99.0. No other questions or concerns at this time. Patient agreeable to come to [facility] later today and again tomorrow afternoon for injections.

## 2014-09-12 ENCOUNTER — Ambulatory Visit (HOSPITAL_BASED_OUTPATIENT_CLINIC_OR_DEPARTMENT_OTHER): Payer: 59

## 2014-09-12 ENCOUNTER — Telehealth: Payer: Self-pay | Admitting: *Deleted

## 2014-09-12 ENCOUNTER — Other Ambulatory Visit: Payer: Self-pay | Admitting: *Deleted

## 2014-09-12 ENCOUNTER — Other Ambulatory Visit: Payer: Self-pay | Admitting: Oncology

## 2014-09-12 VITALS — BP 133/77 | HR 123 | Temp 100.8°F

## 2014-09-12 DIAGNOSIS — C5701 Malignant neoplasm of right fallopian tube: Secondary | ICD-10-CM

## 2014-09-12 DIAGNOSIS — G4700 Insomnia, unspecified: Secondary | ICD-10-CM

## 2014-09-12 DIAGNOSIS — C5700 Malignant neoplasm of unspecified fallopian tube: Secondary | ICD-10-CM

## 2014-09-12 DIAGNOSIS — Z5189 Encounter for other specified aftercare: Secondary | ICD-10-CM | POA: Diagnosis not present

## 2014-09-12 LAB — CBC WITH DIFFERENTIAL/PLATELET
BASO%: 0.2 % (ref 0.0–2.0)
Basophils Absolute: 0 10*3/uL (ref 0.0–0.1)
EOS%: 2.1 % (ref 0.0–7.0)
Eosinophils Absolute: 0.2 10*3/uL (ref 0.0–0.5)
HCT: 33.1 % — ABNORMAL LOW (ref 34.8–46.6)
HEMOGLOBIN: 11 g/dL — AB (ref 11.6–15.9)
LYMPH%: 18.1 % (ref 14.0–49.7)
MCH: 27.6 pg (ref 25.1–34.0)
MCHC: 33.2 g/dL (ref 31.5–36.0)
MCV: 83.2 fL (ref 79.5–101.0)
MONO#: 1.1 10*3/uL — ABNORMAL HIGH (ref 0.1–0.9)
MONO%: 12.8 % (ref 0.0–14.0)
NEUT#: 5.5 10*3/uL (ref 1.5–6.5)
NEUT%: 66.8 % (ref 38.4–76.8)
PLATELETS: 149 10*3/uL (ref 145–400)
RBC: 3.98 10*6/uL (ref 3.70–5.45)
RDW: 13.5 % (ref 11.2–14.5)
WBC: 8.2 10*3/uL (ref 3.9–10.3)
lymph#: 1.5 10*3/uL (ref 0.9–3.3)
nRBC: 0 % (ref 0–0)

## 2014-09-12 MED ORDER — TBO-FILGRASTIM 480 MCG/0.8ML ~~LOC~~ SOSY
480.0000 ug | PREFILLED_SYRINGE | Freq: Once | SUBCUTANEOUS | Status: AC
  Administered 2014-09-12: 480 ug via SUBCUTANEOUS
  Filled 2014-09-12: qty 0.8

## 2014-09-12 MED ORDER — ZOLPIDEM TARTRATE 5 MG PO TABS: 5.0000 mg | ORAL_TABLET | Freq: Every evening | ORAL | Status: AC | PRN

## 2014-09-12 NOTE — Telephone Encounter (Signed)
Spoke with Dr. Marko Plume regarding patient's fevers (101.0 and 100.8) today while she was at [facility] getting granix injection. CBC reviewed by Dr. Marko Plume and lab and injection cancelled for tomorrow.   Patient met in lobby and given copy of labs from today. Patient denies any issues with urinating, no sore thorat/runny nose, and said the abdominal pain she has previously had has been gone the past 2 days.  Per Dr. Marko Plume, patient instructed to take tylenol at home every 4-6 hours.  Also, told patient if she developed rigors or any additional symptoms tonight to go the ED. She is agreeable to this and will call us in the morning with an update.

## 2014-09-12 NOTE — Telephone Encounter (Signed)
VM message from patient received at 2:17 pm. Pt states she had a 'hard day' yesterday after getting granix injection, did not sleep well d/t aches. She tried her tramadol and oxycodone-neither of them helped. She is requesting Ambien. This is not currently on her med list.

## 2014-09-12 NOTE — Telephone Encounter (Signed)
Re Ambien  Please send script for Ambien 5 mg one qhs prn sleep #30    Hopefully so she can get it for tonight   thanks

## 2014-09-12 NOTE — Progress Notes (Signed)
Ana Luna here for Granix injection.  Complaining of bone aches and chills and fever.  Temp at home 100.3--here it is 100.8.  Is drinking and eating without difficulty.  Notified Erasmo Downer, RN who is going to call Dr Marko Plume.

## 2014-09-12 NOTE — Telephone Encounter (Signed)
Prescription called into patient's pharmacy as noted below by Dr. Marko Plume.

## 2014-09-13 ENCOUNTER — Other Ambulatory Visit: Payer: 59

## 2014-09-13 ENCOUNTER — Ambulatory Visit: Payer: 59

## 2014-09-13 NOTE — Telephone Encounter (Signed)
Called to check on patient today. She states "I feel so so much better this morning." Patient states she took her temperature several times last night and again this morning and it was normal each time. Patient states she has been taking tylenol every 4-6 hours as instructed yesterday. Denies any chills last night or today. Reminded patient of appts at [facility] on Monday, 09-17-14, and instructed her to call our office if she develops a fever or any other symptoms prior to the scheduled appts.

## 2014-09-14 ENCOUNTER — Encounter: Payer: Self-pay | Admitting: Genetic Counselor

## 2014-09-17 ENCOUNTER — Other Ambulatory Visit: Payer: 59

## 2014-09-17 ENCOUNTER — Encounter: Payer: 59 | Admitting: Genetic Counselor

## 2014-09-17 ENCOUNTER — Other Ambulatory Visit: Payer: Self-pay | Admitting: Oncology

## 2014-09-17 ENCOUNTER — Ambulatory Visit: Payer: 59 | Attending: Gynecologic Oncology | Admitting: Gynecologic Oncology

## 2014-09-17 ENCOUNTER — Encounter: Payer: Self-pay | Admitting: Gynecologic Oncology

## 2014-09-17 ENCOUNTER — Encounter: Payer: Self-pay | Admitting: Oncology

## 2014-09-17 ENCOUNTER — Ambulatory Visit: Payer: 59 | Admitting: Oncology

## 2014-09-17 VITALS — BP 138/80 | HR 110 | Temp 99.3°F | Resp 18 | Ht 63.0 in | Wt 175.9 lb

## 2014-09-17 DIAGNOSIS — C5701 Malignant neoplasm of right fallopian tube: Secondary | ICD-10-CM | POA: Insufficient documentation

## 2014-09-17 NOTE — Progress Notes (Signed)
POSTOPERATIVE FOLLOWUP VISIT  Chief Complaint:  Stage IIIC fallopian tube cancer found incidentally at time of surgery. Postoperative followup  Assessment:    54 y.o. 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   S/p exploratory laparotomy, BSO, omentectomy, radical tumor debulking with optimal cytoreduction on 07/31/14. Doing well postop from a surgical standpoint. S/p cycle 1 of carboplatin and paclitaxel, due for cycle 2 on 09/21/14. Transferring care to [facility].  Plan: Overall she is tolerating her first cycle of chemotherapy well I recommended continuing for 6 cycles prior to reevaluation for complete response and discontinuing therapy if that has occurred.  We again discussed her underlying diagnosis, surgical findings, and prognosis. I discussed that overall this is a serious diagnosis with a high chance of therapy inducing complete response, however high risk for recurrence. Long-term cure occurs in only one quarter to one third of patients with this underlying diagnosis. However given her young age at diagnosis, and good general health, I believe she is a good candidate for aggressive upfront therapy.  She should see genetics counseling at today for further evaluation of an underlying mutation that might alter attention of future treatments for her an knowledge of additional cancer risk.  HPI:  Ana Luna is a 54 y.o. year old initially seen in consultation on 06/11/14 referred by Dr Landry Mellow for presumed cervical or endometrial cancer.    The patient underwent routine cervical cytologic assessment of the cervix (pap test) in March, 2016. Of note the patient has been compliant with Pap smear surveillance in previous years. Her pap from 05/16/2014 showed adenocarcinoma and when reviewed by Dr. Lyndon Code from [facility] pathology was felt to favor an endometrioid origin. High-risk HPV was not detected in the sample. The patient was then seen by OB/GYN Dr.  Christophe Louis who performed endometrial sampling with a pipelle in the office. This was performed on 05/29/2014. This biopsy was suspicious for adenocarcinoma with fragments of in active endometrium and microscopic detached atypical glandular fragments which is suspicious for adenocarcinoma.  To differentiate between cervical vs endometrial primary, she underwent an MRI of the pelvis on 06/18/14 which revealed: No evidence of cervical mass on MRI. No evidence of endometrial mass on MRI. Endometrial complex measures 7 mm. Uterine adenomyosis. 2.5 cm enhancing intramural lesion in the left uterine body, favored to reflect an intramural fibroid, less likely focal adenomyosis. A small amount of pelvic fluid was noted. No lymphadenopathy was seen.  She was offered a surgical date in May, 2016 for robotic hysterectomy, BSO and lymphadenectomy for presumed endometrial cancer, however the patient declined the earlier date in favor of the previously scheduled date on June 7th.  She then underwent a diagnostic laparoscopy converted to laparotomy, TAH, BSO, omentectomy and radical tumor debulking on 07/28/01 without complications. An optimal cytoreduction was achieved with <1cm3 implants of residual tumor in 2 locations on the hepatic capsule and the posterior pelvic cul de sac. Her postoperative course was uncomplicated.  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   She was recommended to receive 6 cycles of adjuvant platinum and taxane adjuvant therapy. She received her first cycle on 08/31/14 which she has tolerated fairly well. She is beginning to notice allopecia. Myalgias and fatigue were her major symptoms on days 2-5.  She has transferred care to Dr Ulice Dash at [facility] and plans to have cycle 2 there on 09/21/14.  She will see Genetics counselors today.  Review of systems: Constitutional:  She has no  weight gain or weight loss. She has no fever or chills. Eyes: No blurred vision Ears, Nose, Mouth, Throat: No dizziness, headaches or changes in hearing. No mouth sores. Cardiovascular: No chest pain, palpitations or edema. Respiratory:  No shortness of breath, wheezing or cough Gastrointestinal: She has normal bowel movements without diarrhea or constipation. She denies any nausea or vomiting. She denies blood in her stool or heart burn. Genitourinary:  She denies pelvic pain, pelvic pressure or changes in her urinary function. She has no hematuria, dysuria, or incontinence. She has no irregular vaginal bleeding or vaginal discharge Musculoskeletal: Denies muscle weakness or joint pains.  Skin:  She has no skin changes, rashes or itching Neurological:  Denies dizziness or headaches. No neuropathy, no numbness or tingling. Psychiatric:  She denies depression or anxiety. Hematologic/Lymphatic:   No easy bruising or bleeding   Physical Exam: Blood pressure 138/80, pulse 110, temperature 99.3 F (37.4 C), temperature source Oral, resp. rate 18, height 5\' 3"  (1.6 m), weight 175 lb 14.4 oz (79.788 kg), last menstrual period 07/08/2011, SpO2 100 %. General: Well dressed, well nourished in no apparent distress.   HEENT:  Normocephalic and atraumatic, no lesions.  Extraocular muscles intact. Sclerae anicteric. Pupils equal, round, reactive. No mouth sores or ulcers. Thyroid is normal size, not nodular, midline. Skin:  No lesions or rashes. Lungs:  Clear to auscultation bilaterally.  No wheezes. Cardiovascular:  Regular rate and rhythm.  No murmurs or rubs. Abdomen:  Soft, nontender, nondistended.  No palpable masses.  No hepatosplenomegaly.  No ascites. Normal bowel sounds.  No hernias.  Incision is healing well. No evidence for infection. Genitourinary: Normal EGBUS  Vaginal cuff intact.  No bleeding or discharge.  No cul de sac fullness. Extremities: No cyanosis, clubbing or edema.  No calf  tenderness or erythema. No palpable cords. Psychiatric: Mood and affect are appropriate. Neurological: Awake, alert and oriented x 3. Sensation is intact, no neuropathy.  Musculoskeletal: No pain, normal strength and range of motion.   Donaciano Eva, MD

## 2014-09-17 NOTE — Patient Instructions (Signed)
Please call us in the future with any questions or concerns or if we can be of any assistance.

## 2014-09-17 NOTE — Progress Notes (Signed)
Medical On cology  Per gyn oncology staff, patient called that office today and requested that all appointments with their clinic and with medical oncology be cancelled, including chemotherapy and genetics counseling.  She was to have consultation at [facility] on 09-14-14. No information in [facility] now and I have not been contacted by that physician. Appointments cancelled at patient's request.  Godfrey Pick, MD

## 2014-09-18 ENCOUNTER — Other Ambulatory Visit: Payer: Self-pay | Admitting: Oncology

## 2014-09-21 ENCOUNTER — Ambulatory Visit: Payer: 59

## 2014-09-24 ENCOUNTER — Telehealth: Payer: Self-pay | Admitting: *Deleted

## 2014-09-24 NOTE — Telephone Encounter (Signed)
Ana Luna with [employer] Short term disability claims left voicemail.  "Not sure if Dr. Marko Plume is keeping her out of work or if she placed patient on disability but we need update.  Please call [phone] opt 1 ext [phone] with  1. Confirmation of last office visit   2. Next office visit 3. Current treatment plan 4. Current restrictions preventing her from work  5. Return date for work or Plan   This nurse unable to see any disability claim information in media or notes.  Called [employer] with message including dates of 09-10-2014 as last date seen by Dr. Marko Plume and 09-17-2014 as late date seen by Dr. Denman George and patient has dismissed this team from her care.  Care has been transferred care to [facility].  No further information provided.  Awaiting return call from [employer].

## 2014-09-27 ENCOUNTER — Encounter: Payer: Self-pay | Admitting: Oncology

## 2014-09-27 NOTE — Progress Notes (Signed)
I placed STD forms on desk of nurse for dr. Marko Plume

## 2014-09-28 ENCOUNTER — Ambulatory Visit: Payer: 59

## 2014-09-28 NOTE — Telephone Encounter (Signed)
Copy placed with collaborative by [employer] who reportedly keep all forms.

## 2014-10-01 ENCOUNTER — Other Ambulatory Visit: Payer: 59

## 2014-10-01 ENCOUNTER — Ambulatory Visit: Payer: 59 | Admitting: Oncology

## 2014-10-02 NOTE — Telephone Encounter (Signed)
Notified [name] that Dr. Marko Plume received FMLA papers.   [name] said to disregard those papers as she going to [facility] for her care.

## 2014-10-08 ENCOUNTER — Other Ambulatory Visit: Payer: 59

## 2014-10-08 ENCOUNTER — Ambulatory Visit: Payer: 59 | Admitting: Oncology

## 2015-02-21 ENCOUNTER — Other Ambulatory Visit: Payer: Self-pay | Admitting: Nurse Practitioner

## 2015-07-19 ENCOUNTER — Other Ambulatory Visit: Payer: Self-pay

## 2015-07-19 DIAGNOSIS — Z1231 Encounter for screening mammogram for malignant neoplasm of breast: Secondary | ICD-10-CM

## 2015-07-19 DIAGNOSIS — Z8543 Personal history of malignant neoplasm of ovary: Secondary | ICD-10-CM

## 2015-07-29 ENCOUNTER — Ambulatory Visit: Payer: 59

## 2015-08-09 ENCOUNTER — Ambulatory Visit
Admission: RE | Admit: 2015-08-09 | Discharge: 2015-08-09 | Disposition: A | Discharge status: Home / Self Care | Payer: 59 | Source: Ambulatory Visit

## 2015-08-09 DIAGNOSIS — Z8543 Personal history of malignant neoplasm of ovary: Secondary | ICD-10-CM

## 2015-08-09 DIAGNOSIS — Z1231 Encounter for screening mammogram for malignant neoplasm of breast: Secondary | ICD-10-CM

## 2015-12-25 ENCOUNTER — Encounter: Payer: 59 | Attending: Family Medicine | Admitting: *Deleted

## 2015-12-25 DIAGNOSIS — Z713 Dietary counseling and surveillance: Secondary | ICD-10-CM | POA: Insufficient documentation

## 2015-12-25 DIAGNOSIS — E1165 Type 2 diabetes mellitus with hyperglycemia: Secondary | ICD-10-CM | POA: Diagnosis not present

## 2015-12-25 DIAGNOSIS — E119 Type 2 diabetes mellitus without complications: Secondary | ICD-10-CM

## 2015-12-25 NOTE — Patient Instructions (Signed)
Plan:  Aim for 2 Carb Choices per meal (30 grams) +/- 1 either way  Aim for 0-1 Carbs per snack if hungry  Include protein in moderation with your meals and snacks Consider reading food labels for Total Carbohydrate of foods Consider  increasing your activity level by walking for 10-15 minutes daily as tolerated Continuer checking BG at alternate times per day Continue taking medication Metformin and Basaglar as directed by MD

## 2015-12-25 NOTE — Progress Notes (Signed)
Diabetes Self-Management Education  Visit Type: First/Initial  Appt. Start Time: 1000 Appt. End Time: 1130  12/25/2015  [name], identified by name and date of birth, is a 55 y.o. female with a diagnosis of Diabetes: Type 2. She is in treatment for reoccurring cancer and was recently diagnosed with Diabetes (2 weeks ago with an A1c of 10.4% She works full time in [employer]eronaut[employer] and states her co-workers and her husband are very supportive. She has not been able to exercise due to a low energy level. She does have a One Touch Meter and is testing her BG before breakfast and supper each day with reported BG range of 130-150 mg/dl. She states she has no questions regarding testing or taking her insulin.  ASSESSMENT  Height 5\' 3"  (1.6 m), weight 192 lb 11.2 oz (87.4 kg), last menstrual period 07/08/2011. Body mass index is 34.14 kg/m.      Diabetes Self-Management Education - 12/25/15 1004      Visit Information   Visit Type First/Initial     Initial Visit   Diabetes Type Type 2   Are you currently following a meal plan? No   Are you taking your medications as prescribed? Yes   Date Diagnosed 12/08/2015     Health Coping   How would you rate your overall health? Fair     Psychosocial Assessment   Patient Belief/Attitude about Diabetes Motivated to manage diabetes   Self-care barriers None   Self-management support Family   Other persons present Patient   Patient Concerns Nutrition/Meal planning;Glycemic Control;Medication   Special Needs None   Preferred Learning Style Hands on;Auditory   What is the last grade level you completed in school? bachelors degree     Pre-Education Assessment   Patient understands the diabetes disease and treatment process. Needs Instruction   Patient understands incorporating nutritional management into lifestyle. Needs Instruction   Patient undertands incorporating physical activity into lifestyle. Needs Instruction   Patient  understands using medications safely. Needs Instruction   Patient understands monitoring blood glucose, interpreting and using results Needs Review   Patient understands prevention, detection, and treatment of acute complications. Needs Instruction   Patient understands prevention, detection, and treatment of chronic complications. Needs Instruction   Patient understands how to develop strategies to address psychosocial issues. Needs Review   Patient understands how to develop strategies to promote health/change behavior. Needs Review     Complications   Last HgB A1C per patient/outside source 10.4 %   How often do you check your blood sugar? 1-2 times/day   Fasting Blood glucose range (mg/dL) 130-179   Postprandial Blood glucose range (mg/dL) 130-179   Number of hypoglycemic episodes per month 0   Have you had a dilated eye exam in the past 12 months? Yes   Have you had a dental exam in the past 12 months? No   Are you checking your feet? No     Dietary Intake   Breakfast 1 slice whole wheat bread, 1 Tbsp PNB (organic), or cream cheese   Snack (morning) occasionally fresh fruit (berries)   Lunch black bean soup OR zucchini and lentil pasta with marinara sauce OR rice with bean soup OR dark green salad with many vegetables   Snack (afternoon) popcorn (organic) or hummus or guacomole with veggie chips or red peppers    Dinner bean soup, rice, salad   Snack (evening) not usually, used to have ice cream often, not anymore   Beverage(s) coffee with rice  or almond milk, 1 tsp cane sugar, water     Exercise   Exercise Type ADL's   How many days per week to you exercise? 0   How many minutes per day do you exercise? 0   Total minutes per week of exercise 0     Patient Education   Previous Diabetes Education No   Disease state  Definition of diabetes, type 1 and 2, and the diagnosis of diabetes   Nutrition management  Role of diet in the treatment of diabetes and the relationship between  the three main macronutrients and blood glucose level;Food label reading, portion sizes and measuring food.   Physical activity and exercise  Role of exercise on diabetes management, blood pressure control and cardiac health.   Medications Reviewed patients medication for diabetes, action, purpose, timing of dose and side effects.   Monitoring Purpose and frequency of SMBG.;Identified appropriate SMBG and/or A1C goals.   Acute complications Taught treatment of hypoglycemia - the 15 rule.   Chronic complications Relationship between chronic complications and blood glucose control   Psychosocial adjustment Role of stress on diabetes;Identified and addressed patients feelings and concerns about diabetes     Individualized Goals (developed by patient)   Nutrition Follow meal plan discussed   Physical Activity 15 minutes per day   Medications take my medication as prescribed   Monitoring  test blood glucose pre and post meals as discussed     Post-Education Assessment   Patient understands the diabetes disease and treatment process. Demonstrates understanding / competency   Patient understands incorporating nutritional management into lifestyle. Demonstrates understanding / competency   Patient undertands incorporating physical activity into lifestyle. Demonstrates understanding / competency   Patient understands using medications safely. Demonstrates understanding / competency   Patient understands monitoring blood glucose, interpreting and using results Demonstrates understanding / competency   Patient understands prevention, detection, and treatment of acute complications. Demonstrates understanding / competency   Patient understands prevention, detection, and treatment of chronic complications. Demonstrates understanding / competency   Patient understands how to develop strategies to address psychosocial issues. Demonstrates understanding / competency   Patient understands how to develop  strategies to promote health/change behavior. Demonstrates understanding / competency     Outcomes   Expected Outcomes Demonstrated interest in learning. Expect positive outcomes   Future DMSE PRN   Program Status Completed      Individualized Plan for Diabetes Self-Management Training:   Learning Objective:  Patient will have a greater understanding of diabetes self-management. Patient education plan is to attend individual and/or group sessions per assessed needs and concerns.   Plan:   Patient Instructions  Plan:  Aim for 2 Carb Choices per meal (30 grams) +/- 1 either way  Aim for 0-1 Carbs per snack if hungry  Include protein in moderation with your meals and snacks Consider reading food labels for Total Carbohydrate of foods Consider  increasing your activity level by walking for 10-15 minutes daily as tolerated Continuer checking BG at alternate times per day Continue taking medication Metformin and Basaglar as directed by MD      Expected Outcomes:  Demonstrated interest in learning. Expect positive outcomes  Education material provided: Living Well with Diabetes, A1C conversion sheet, Meal plan card and Carbohydrate counting sheet  If problems or questions, patient to contact team via:  Phone and Email  Future DSME appointment: PRN

## 2016-01-15 ENCOUNTER — Encounter (HOSPITAL_COMMUNITY): Payer: Self-pay

## 2016-03-04 DIAGNOSIS — J209 Acute bronchitis, unspecified: Secondary | ICD-10-CM | POA: Diagnosis not present

## 2016-03-04 DIAGNOSIS — E119 Type 2 diabetes mellitus without complications: Secondary | ICD-10-CM | POA: Diagnosis not present

## 2016-03-05 IMAGING — CT CT ABD-PELV W/ CM
2 of 6 series · 16 of 46 positions shown, 18 images · IV contrast (OMNIPAQUE)
Comparison: None.

MRI abdomen 06/18/2014

CLINICAL DATA: Fallopian tube carcinoma diagnosed May 2014.
Hysterectomy [DATE]. Chemotherapy started August 2014.

EXAM:
CT CHEST, ABDOMEN, AND PELVIS WITH CONTRAST
TECHNIQUE: Multidetector CT imaging of the chest, abdomen and pelvis was
performed following the standard protocol during bolus
administration of intravenous contrast.
CONTRAST:  100mL OMNIPAQUE IOHEXOL 300 MG/ML  SOLN

[Series 2: cap with st · axial · 0.73mm/px · z∈[-748,-218]mm · 13 of 122 slices shown, 15 images]
[im 8/122  soft-tissue]
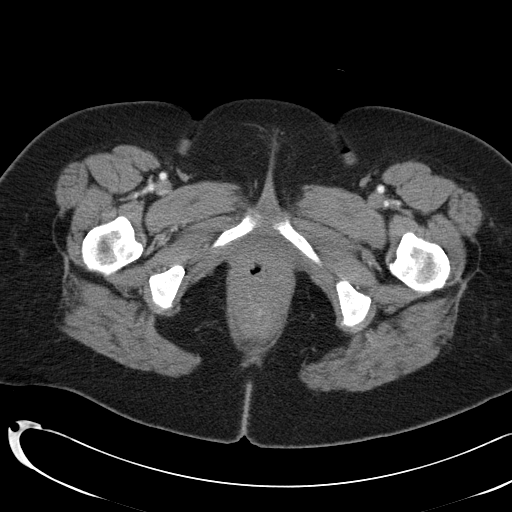
[im 8/122  bone]
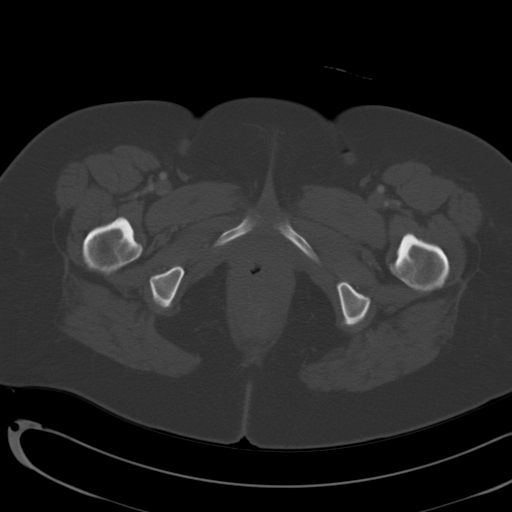
[im 15/122  soft-tissue]
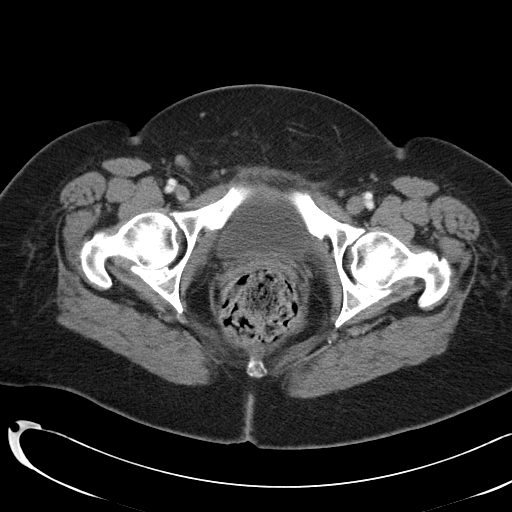
[im 29/122  soft-tissue]
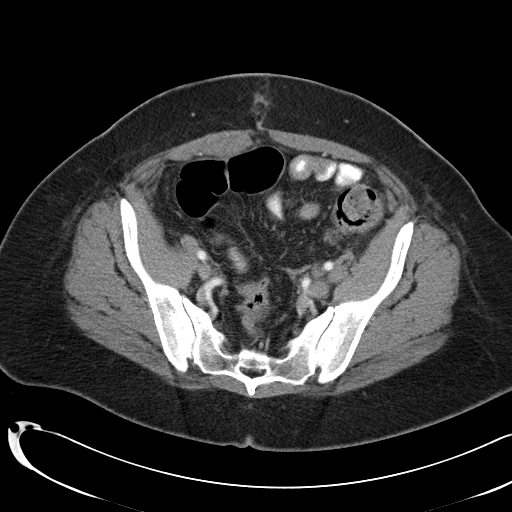
[im 36/122  soft-tissue]
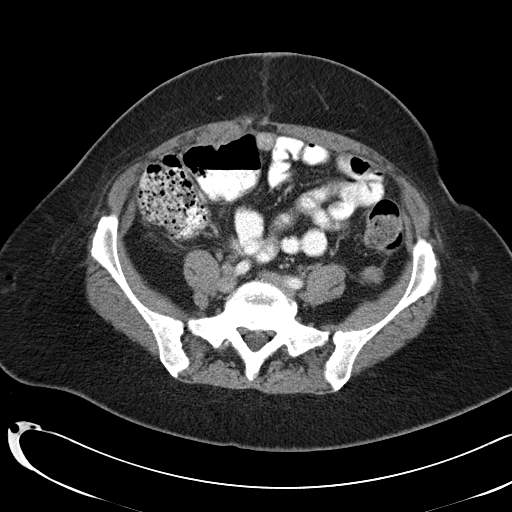
[im 43/122  soft-tissue]
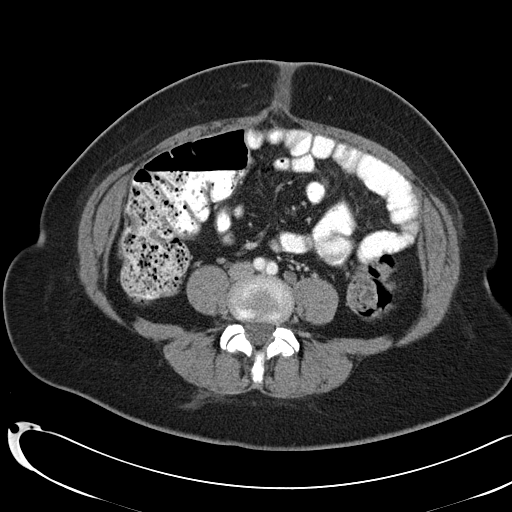
[im 50/122  soft-tissue]
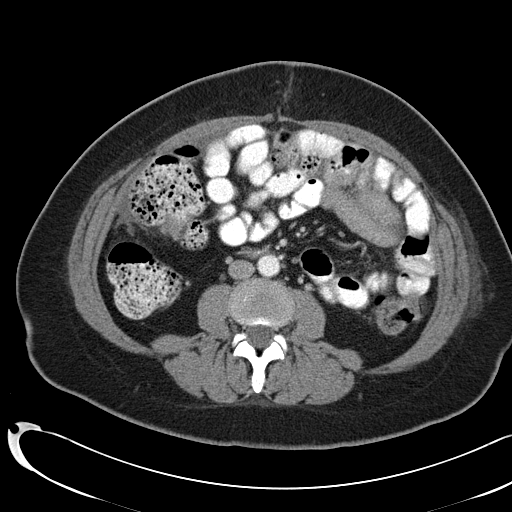
[im 65/122  soft-tissue]
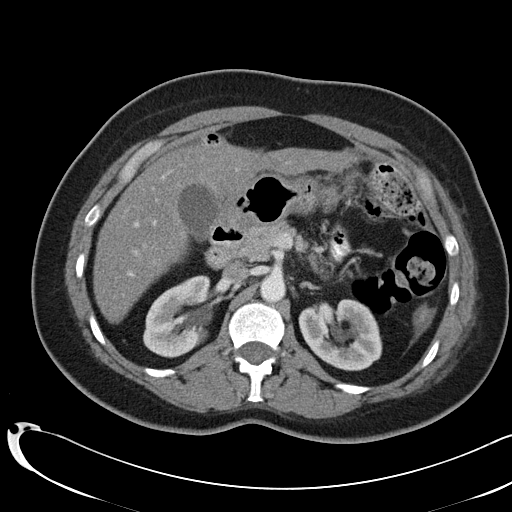
[im 72/122  soft-tissue]
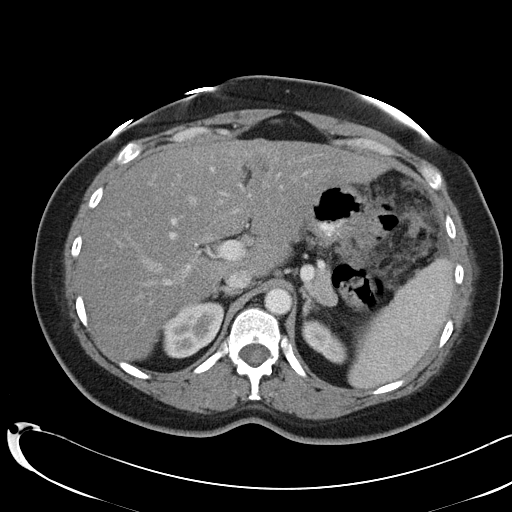
[im 79/122  soft-tissue]
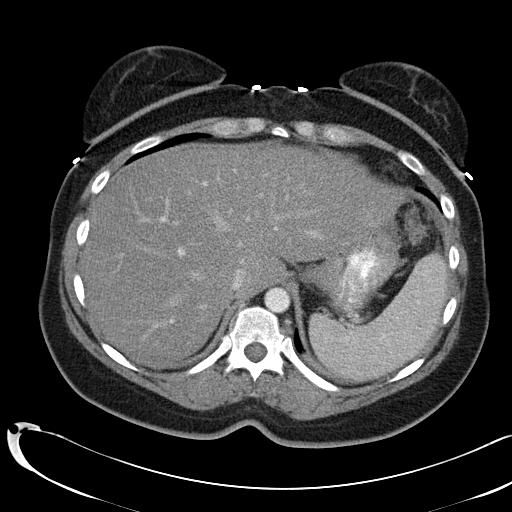
[im 79/122  bone]
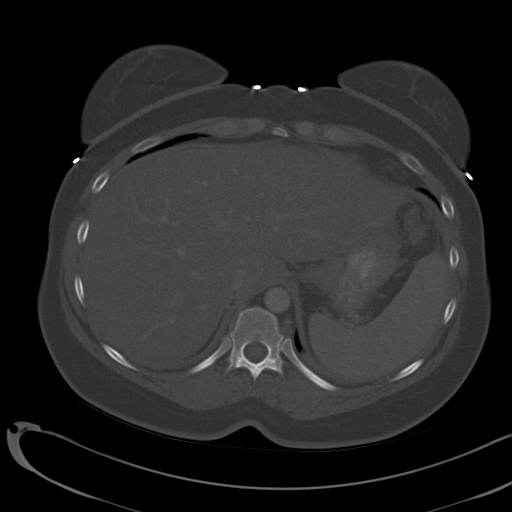
[im 86/122  soft-tissue]
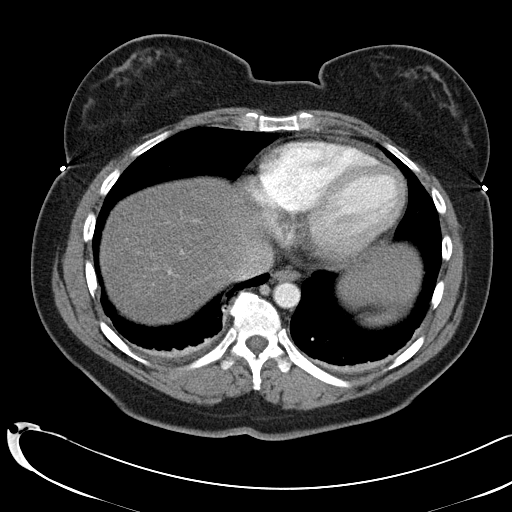
[im 93/122  soft-tissue]
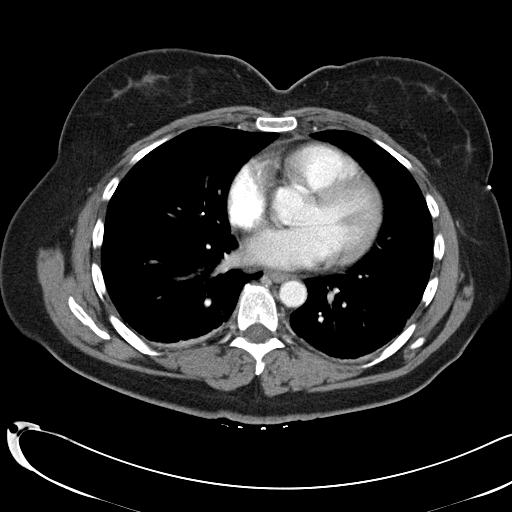
[im 107/122  soft-tissue]
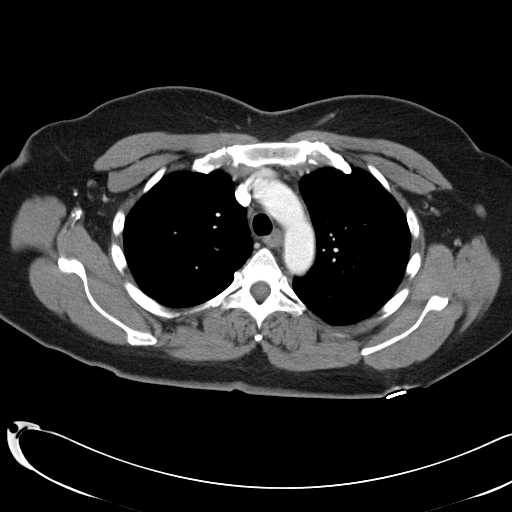
[im 114/122  soft-tissue]
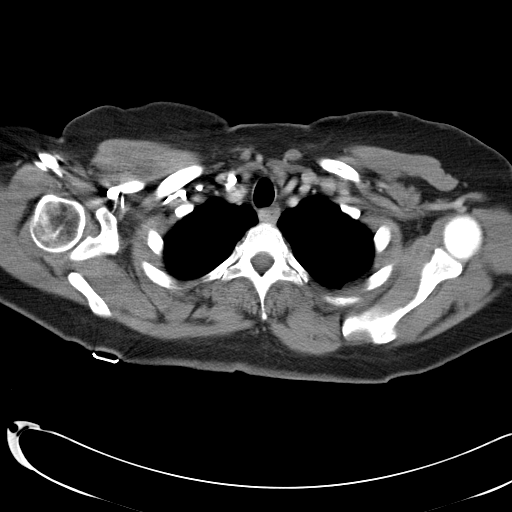

[Series 602: <mpr thick range> · coronal · 1.19mm/px · 3 of 93 slices shown]
[im 31/93  soft-tissue]
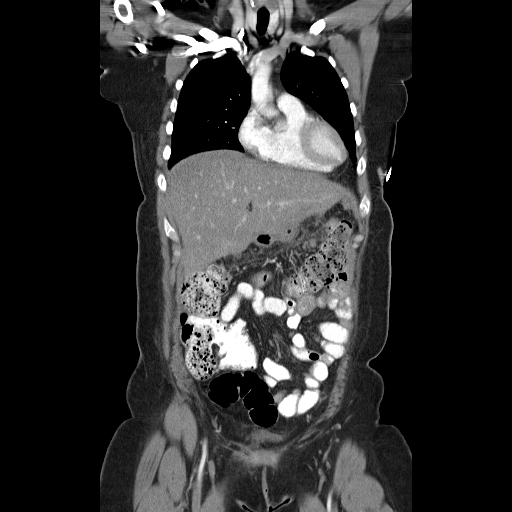
[im 41/93  soft-tissue]
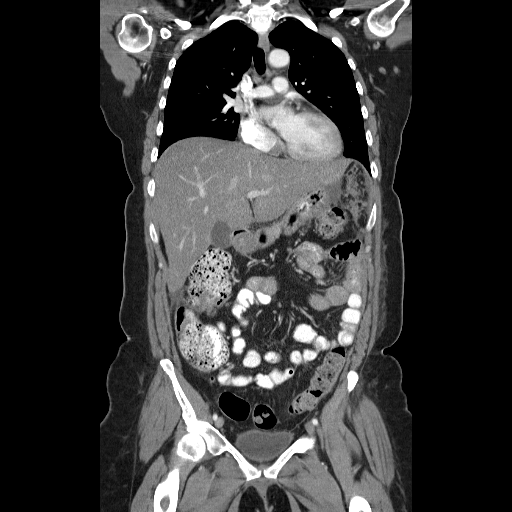
[im 52/93  soft-tissue]
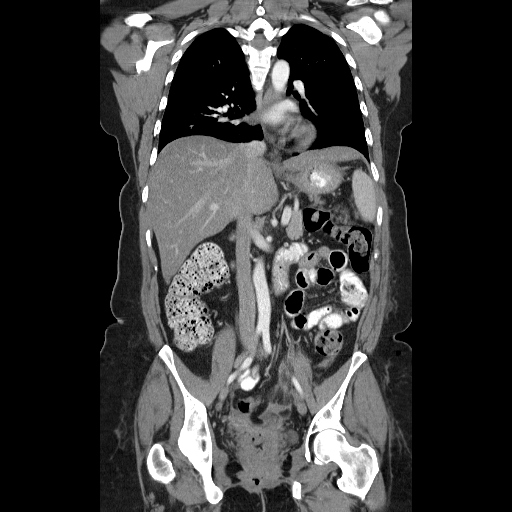

[16 of 46 positions shown; findings below may reference images not displayed]

FINDINGS: CT CHEST FINDINGS

Mediastinum/Nodes: No axillary or supraclavicular lymphadenopathy.
No mediastinal hilar lymphadenopathy. No pericardial fluid. No
central pulmonary embolism. Esophagus normal

Lungs/Pleura: There is linear left basilar atelectasis. Small
bilateral pleural effusions. No measurable pulmonary nodularity.

CT ABDOMEN AND PELVIS FINDINGS

Hepatobiliary: IRounded low-attenuation region along the falciform
ligament measuring 2.4 by 2.2 cm on image 54, series 2. Low-density
region borders the cleft of falciform ligament seen on coronal image
25. No biliary duct dilatation the gallbladder is normal.

Pancreas: Pancreas is normal. No ductal dilatation. No pancreatic
inflammation.

Spleen: Normal spleen

Adrenals/urinary tract: Adrenal glands and kidneys are normal. The
ureters and bladder normal. Delayed renal imaging which isno
abnormality proximal ureters. There is minimal flow the bladder on
delayed imaging but no evidence leak.

Stomach/Bowel: Stomach, small bowel, appendix and cecum normal. The
colon has a moderate volume stool throughout course and colon. Large
stool ball in the rectum.

Vascular/Lymphatic: Abdominal aorta normal caliber. No periportal
lymphadenopathy. Left common iliac lymph node measures 9 mm at the
bifurcation on image 81, series 2. Small right common iliac node
measures 7 mm on image 86.

Reproductive: Post hysterectomy. Consider oophorectomy. No
nodularity at vaginal cuff. Nosidewall mass.

Musculoskeletal: No aggressive osseous lesion.

Other: There is nodularity within the ventral peritoneal space
adjacent to the greater curvature of the stomach as well as in the
gastrosplenic ligament. Discrete nodules measure 9 mm and 9 mm on
image 63 and 62 of series 2 within the omentum. Nodularity in the
gastrosplenic ligament is less defined. 10 mm nodule on image 50 for
example.

There is thickening along the undersurface of the right rectus
abdominus muscle at the level of the umbilicus on image 87.
IMPRESSION: Chest Impression:

1. No evidence of thoracic metastasis.
2. Small bilateral pleural effusions.

Abdomen / Pelvis Impression:

1. Evidence of peritoneal disease with nodularity in the greater
omentum and the gastrosplenic ligament.
2. Thickening undersurface of the right rectus abdominus muscle may
relate to surgery. Cannot exclude ventral peritoneal metastasis.
3. Lesion along the falciform ligament ligament of the liver with
differential including subcapsular peritoneal metastasis versus
retraction injury from recent surgery.
4. Small common iliac lymph nodes are concerning for nodal
metastasis.
5. No evidence the ureter injury.

## 2016-03-17 DIAGNOSIS — Z5111 Encounter for antineoplastic chemotherapy: Secondary | ICD-10-CM | POA: Diagnosis not present

## 2016-03-17 DIAGNOSIS — I158 Other secondary hypertension: Secondary | ICD-10-CM | POA: Diagnosis not present

## 2016-03-17 DIAGNOSIS — Z006 Encounter for examination for normal comparison and control in clinical research program: Secondary | ICD-10-CM | POA: Diagnosis not present

## 2016-04-14 DIAGNOSIS — R05 Cough: Secondary | ICD-10-CM | POA: Diagnosis not present

## 2016-04-16 DIAGNOSIS — R197 Diarrhea, unspecified: Secondary | ICD-10-CM | POA: Diagnosis not present

## 2016-04-16 DIAGNOSIS — R030 Elevated blood-pressure reading, without diagnosis of hypertension: Secondary | ICD-10-CM | POA: Diagnosis not present

## 2016-04-16 DIAGNOSIS — Z006 Encounter for examination for normal comparison and control in clinical research program: Secondary | ICD-10-CM | POA: Diagnosis not present

## 2016-04-27 DIAGNOSIS — C786 Secondary malignant neoplasm of retroperitoneum and peritoneum: Secondary | ICD-10-CM | POA: Diagnosis not present

## 2016-05-14 DIAGNOSIS — Z006 Encounter for examination for normal comparison and control in clinical research program: Secondary | ICD-10-CM | POA: Diagnosis not present

## 2016-05-14 DIAGNOSIS — R05 Cough: Secondary | ICD-10-CM | POA: Diagnosis not present

## 2016-05-14 DIAGNOSIS — C786 Secondary malignant neoplasm of retroperitoneum and peritoneum: Secondary | ICD-10-CM | POA: Diagnosis not present

## 2016-06-11 DIAGNOSIS — R030 Elevated blood-pressure reading, without diagnosis of hypertension: Secondary | ICD-10-CM | POA: Diagnosis not present

## 2016-06-11 DIAGNOSIS — Z006 Encounter for examination for normal comparison and control in clinical research program: Secondary | ICD-10-CM | POA: Diagnosis not present

## 2016-07-03 DIAGNOSIS — M7732 Calcaneal spur, left foot: Secondary | ICD-10-CM | POA: Diagnosis not present

## 2016-07-03 DIAGNOSIS — M722 Plantar fascial fibromatosis: Secondary | ICD-10-CM | POA: Diagnosis not present

## 2016-07-03 DIAGNOSIS — M65872 Other synovitis and tenosynovitis, left ankle and foot: Secondary | ICD-10-CM | POA: Diagnosis not present

## 2016-07-03 DIAGNOSIS — M79672 Pain in left foot: Secondary | ICD-10-CM | POA: Diagnosis not present

## 2016-07-09 DIAGNOSIS — K760 Fatty (change of) liver, not elsewhere classified: Secondary | ICD-10-CM | POA: Diagnosis not present

## 2016-07-09 DIAGNOSIS — C786 Secondary malignant neoplasm of retroperitoneum and peritoneum: Secondary | ICD-10-CM | POA: Diagnosis not present

## 2016-07-09 DIAGNOSIS — Z006 Encounter for examination for normal comparison and control in clinical research program: Secondary | ICD-10-CM | POA: Diagnosis not present

## 2016-07-09 DIAGNOSIS — R030 Elevated blood-pressure reading, without diagnosis of hypertension: Secondary | ICD-10-CM | POA: Diagnosis not present

## 2016-07-13 DIAGNOSIS — R5383 Other fatigue: Secondary | ICD-10-CM | POA: Diagnosis not present

## 2016-07-13 DIAGNOSIS — Z794 Long term (current) use of insulin: Secondary | ICD-10-CM | POA: Diagnosis not present

## 2016-07-13 DIAGNOSIS — E119 Type 2 diabetes mellitus without complications: Secondary | ICD-10-CM | POA: Diagnosis not present

## 2016-08-10 DIAGNOSIS — R030 Elevated blood-pressure reading, without diagnosis of hypertension: Secondary | ICD-10-CM | POA: Diagnosis not present

## 2016-08-10 DIAGNOSIS — Z006 Encounter for examination for normal comparison and control in clinical research program: Secondary | ICD-10-CM | POA: Diagnosis not present

## 2016-09-03 DIAGNOSIS — I10 Essential (primary) hypertension: Secondary | ICD-10-CM | POA: Diagnosis not present

## 2016-09-03 DIAGNOSIS — Z006 Encounter for examination for normal comparison and control in clinical research program: Secondary | ICD-10-CM | POA: Diagnosis not present

## 2016-09-03 DIAGNOSIS — R030 Elevated blood-pressure reading, without diagnosis of hypertension: Secondary | ICD-10-CM | POA: Diagnosis not present

## 2016-10-01 DIAGNOSIS — Z006 Encounter for examination for normal comparison and control in clinical research program: Secondary | ICD-10-CM | POA: Diagnosis not present

## 2016-10-01 DIAGNOSIS — R05 Cough: Secondary | ICD-10-CM | POA: Diagnosis not present

## 2016-10-01 DIAGNOSIS — R030 Elevated blood-pressure reading, without diagnosis of hypertension: Secondary | ICD-10-CM | POA: Diagnosis not present

## 2016-10-01 DIAGNOSIS — E032 Hypothyroidism due to medicaments and other exogenous substances: Secondary | ICD-10-CM | POA: Diagnosis not present

## 2016-10-01 DIAGNOSIS — R946 Abnormal results of thyroid function studies: Secondary | ICD-10-CM | POA: Diagnosis not present

## 2016-10-29 DIAGNOSIS — Z794 Long term (current) use of insulin: Secondary | ICD-10-CM | POA: Diagnosis not present

## 2016-10-29 DIAGNOSIS — K760 Fatty (change of) liver, not elsewhere classified: Secondary | ICD-10-CM | POA: Diagnosis not present

## 2016-10-29 DIAGNOSIS — Z006 Encounter for examination for normal comparison and control in clinical research program: Secondary | ICD-10-CM | POA: Diagnosis not present

## 2016-10-29 DIAGNOSIS — E119 Type 2 diabetes mellitus without complications: Secondary | ICD-10-CM | POA: Diagnosis not present

## 2016-10-29 DIAGNOSIS — E039 Hypothyroidism, unspecified: Secondary | ICD-10-CM | POA: Diagnosis not present

## 2016-10-29 DIAGNOSIS — Z5111 Encounter for antineoplastic chemotherapy: Secondary | ICD-10-CM | POA: Diagnosis not present

## 2016-11-26 DIAGNOSIS — R030 Elevated blood-pressure reading, without diagnosis of hypertension: Secondary | ICD-10-CM | POA: Diagnosis not present

## 2016-11-26 DIAGNOSIS — Z006 Encounter for examination for normal comparison and control in clinical research program: Secondary | ICD-10-CM | POA: Diagnosis not present

## 2016-12-24 DIAGNOSIS — Z006 Encounter for examination for normal comparison and control in clinical research program: Secondary | ICD-10-CM | POA: Diagnosis not present

## 2016-12-24 DIAGNOSIS — R030 Elevated blood-pressure reading, without diagnosis of hypertension: Secondary | ICD-10-CM | POA: Diagnosis not present

## 2017-01-21 DIAGNOSIS — Z006 Encounter for examination for normal comparison and control in clinical research program: Secondary | ICD-10-CM | POA: Diagnosis not present

## 2017-01-21 DIAGNOSIS — R030 Elevated blood-pressure reading, without diagnosis of hypertension: Secondary | ICD-10-CM | POA: Diagnosis not present

## 2017-02-18 DIAGNOSIS — I158 Other secondary hypertension: Secondary | ICD-10-CM | POA: Diagnosis not present

## 2017-02-18 DIAGNOSIS — R112 Nausea with vomiting, unspecified: Secondary | ICD-10-CM | POA: Diagnosis not present

## 2017-02-18 DIAGNOSIS — Z006 Encounter for examination for normal comparison and control in clinical research program: Secondary | ICD-10-CM | POA: Diagnosis not present

## 2017-03-18 DIAGNOSIS — R030 Elevated blood-pressure reading, without diagnosis of hypertension: Secondary | ICD-10-CM | POA: Diagnosis not present

## 2017-03-18 DIAGNOSIS — Z006 Encounter for examination for normal comparison and control in clinical research program: Secondary | ICD-10-CM | POA: Diagnosis not present

## 2017-03-30 DIAGNOSIS — E039 Hypothyroidism, unspecified: Secondary | ICD-10-CM | POA: Diagnosis not present

## 2017-03-30 DIAGNOSIS — R222 Localized swelling, mass and lump, trunk: Secondary | ICD-10-CM | POA: Diagnosis not present

## 2017-03-30 DIAGNOSIS — Z794 Long term (current) use of insulin: Secondary | ICD-10-CM | POA: Diagnosis not present

## 2017-03-30 DIAGNOSIS — E119 Type 2 diabetes mellitus without complications: Secondary | ICD-10-CM | POA: Diagnosis not present

## 2017-04-22 DIAGNOSIS — Z006 Encounter for examination for normal comparison and control in clinical research program: Secondary | ICD-10-CM | POA: Diagnosis not present

## 2017-04-22 DIAGNOSIS — R030 Elevated blood-pressure reading, without diagnosis of hypertension: Secondary | ICD-10-CM | POA: Diagnosis not present

## 2017-05-14 DIAGNOSIS — R030 Elevated blood-pressure reading, without diagnosis of hypertension: Secondary | ICD-10-CM | POA: Diagnosis not present

## 2017-05-14 DIAGNOSIS — Z006 Encounter for examination for normal comparison and control in clinical research program: Secondary | ICD-10-CM | POA: Diagnosis not present

## 2017-05-24 DIAGNOSIS — M5117 Intervertebral disc disorders with radiculopathy, lumbosacral region: Secondary | ICD-10-CM | POA: Diagnosis not present

## 2017-05-24 DIAGNOSIS — M9903 Segmental and somatic dysfunction of lumbar region: Secondary | ICD-10-CM | POA: Diagnosis not present

## 2017-05-24 DIAGNOSIS — M5137 Other intervertebral disc degeneration, lumbosacral region: Secondary | ICD-10-CM | POA: Diagnosis not present

## 2017-05-25 DIAGNOSIS — M5137 Other intervertebral disc degeneration, lumbosacral region: Secondary | ICD-10-CM | POA: Diagnosis not present

## 2017-05-25 DIAGNOSIS — M5117 Intervertebral disc disorders with radiculopathy, lumbosacral region: Secondary | ICD-10-CM | POA: Diagnosis not present

## 2017-05-25 DIAGNOSIS — M9903 Segmental and somatic dysfunction of lumbar region: Secondary | ICD-10-CM | POA: Diagnosis not present

## 2017-05-26 DIAGNOSIS — M5117 Intervertebral disc disorders with radiculopathy, lumbosacral region: Secondary | ICD-10-CM | POA: Diagnosis not present

## 2017-05-26 DIAGNOSIS — M5137 Other intervertebral disc degeneration, lumbosacral region: Secondary | ICD-10-CM | POA: Diagnosis not present

## 2017-05-26 DIAGNOSIS — M9903 Segmental and somatic dysfunction of lumbar region: Secondary | ICD-10-CM | POA: Diagnosis not present

## 2017-05-27 DIAGNOSIS — M5137 Other intervertebral disc degeneration, lumbosacral region: Secondary | ICD-10-CM | POA: Diagnosis not present

## 2017-05-27 DIAGNOSIS — M9903 Segmental and somatic dysfunction of lumbar region: Secondary | ICD-10-CM | POA: Diagnosis not present

## 2017-05-27 DIAGNOSIS — M5117 Intervertebral disc disorders with radiculopathy, lumbosacral region: Secondary | ICD-10-CM | POA: Diagnosis not present

## 2017-05-31 DIAGNOSIS — M5117 Intervertebral disc disorders with radiculopathy, lumbosacral region: Secondary | ICD-10-CM | POA: Diagnosis not present

## 2017-05-31 DIAGNOSIS — M9903 Segmental and somatic dysfunction of lumbar region: Secondary | ICD-10-CM | POA: Diagnosis not present

## 2017-05-31 DIAGNOSIS — M5137 Other intervertebral disc degeneration, lumbosacral region: Secondary | ICD-10-CM | POA: Diagnosis not present

## 2017-06-01 DIAGNOSIS — M5137 Other intervertebral disc degeneration, lumbosacral region: Secondary | ICD-10-CM | POA: Diagnosis not present

## 2017-06-01 DIAGNOSIS — M5117 Intervertebral disc disorders with radiculopathy, lumbosacral region: Secondary | ICD-10-CM | POA: Diagnosis not present

## 2017-06-01 DIAGNOSIS — M9903 Segmental and somatic dysfunction of lumbar region: Secondary | ICD-10-CM | POA: Diagnosis not present

## 2017-06-03 DIAGNOSIS — M5117 Intervertebral disc disorders with radiculopathy, lumbosacral region: Secondary | ICD-10-CM | POA: Diagnosis not present

## 2017-06-03 DIAGNOSIS — M5137 Other intervertebral disc degeneration, lumbosacral region: Secondary | ICD-10-CM | POA: Diagnosis not present

## 2017-06-03 DIAGNOSIS — M9903 Segmental and somatic dysfunction of lumbar region: Secondary | ICD-10-CM | POA: Diagnosis not present

## 2017-06-07 DIAGNOSIS — M5137 Other intervertebral disc degeneration, lumbosacral region: Secondary | ICD-10-CM | POA: Diagnosis not present

## 2017-06-07 DIAGNOSIS — M5117 Intervertebral disc disorders with radiculopathy, lumbosacral region: Secondary | ICD-10-CM | POA: Diagnosis not present

## 2017-06-07 DIAGNOSIS — M9903 Segmental and somatic dysfunction of lumbar region: Secondary | ICD-10-CM | POA: Diagnosis not present

## 2017-06-08 DIAGNOSIS — M5137 Other intervertebral disc degeneration, lumbosacral region: Secondary | ICD-10-CM | POA: Diagnosis not present

## 2017-06-08 DIAGNOSIS — M9903 Segmental and somatic dysfunction of lumbar region: Secondary | ICD-10-CM | POA: Diagnosis not present

## 2017-06-08 DIAGNOSIS — M5117 Intervertebral disc disorders with radiculopathy, lumbosacral region: Secondary | ICD-10-CM | POA: Diagnosis not present

## 2017-06-09 DIAGNOSIS — M9903 Segmental and somatic dysfunction of lumbar region: Secondary | ICD-10-CM | POA: Diagnosis not present

## 2017-06-09 DIAGNOSIS — M5117 Intervertebral disc disorders with radiculopathy, lumbosacral region: Secondary | ICD-10-CM | POA: Diagnosis not present

## 2017-06-09 DIAGNOSIS — M5137 Other intervertebral disc degeneration, lumbosacral region: Secondary | ICD-10-CM | POA: Diagnosis not present

## 2017-06-10 DIAGNOSIS — R030 Elevated blood-pressure reading, without diagnosis of hypertension: Secondary | ICD-10-CM | POA: Diagnosis not present

## 2017-06-10 DIAGNOSIS — Z006 Encounter for examination for normal comparison and control in clinical research program: Secondary | ICD-10-CM | POA: Diagnosis not present

## 2017-06-24 DIAGNOSIS — R1310 Dysphagia, unspecified: Secondary | ICD-10-CM | POA: Diagnosis not present

## 2017-06-24 DIAGNOSIS — K219 Gastro-esophageal reflux disease without esophagitis: Secondary | ICD-10-CM | POA: Diagnosis not present

## 2017-06-24 DIAGNOSIS — M9903 Segmental and somatic dysfunction of lumbar region: Secondary | ICD-10-CM | POA: Diagnosis not present

## 2017-06-24 DIAGNOSIS — M5137 Other intervertebral disc degeneration, lumbosacral region: Secondary | ICD-10-CM | POA: Diagnosis not present

## 2017-06-24 DIAGNOSIS — M5117 Intervertebral disc disorders with radiculopathy, lumbosacral region: Secondary | ICD-10-CM | POA: Diagnosis not present

## 2017-06-30 DIAGNOSIS — M5117 Intervertebral disc disorders with radiculopathy, lumbosacral region: Secondary | ICD-10-CM | POA: Diagnosis not present

## 2017-06-30 DIAGNOSIS — M9903 Segmental and somatic dysfunction of lumbar region: Secondary | ICD-10-CM | POA: Diagnosis not present

## 2017-06-30 DIAGNOSIS — M5137 Other intervertebral disc degeneration, lumbosacral region: Secondary | ICD-10-CM | POA: Diagnosis not present

## 2017-07-06 DIAGNOSIS — M9903 Segmental and somatic dysfunction of lumbar region: Secondary | ICD-10-CM | POA: Diagnosis not present

## 2017-07-06 DIAGNOSIS — M5137 Other intervertebral disc degeneration, lumbosacral region: Secondary | ICD-10-CM | POA: Diagnosis not present

## 2017-07-06 DIAGNOSIS — M5117 Intervertebral disc disorders with radiculopathy, lumbosacral region: Secondary | ICD-10-CM | POA: Diagnosis not present

## 2017-07-07 DIAGNOSIS — M5117 Intervertebral disc disorders with radiculopathy, lumbosacral region: Secondary | ICD-10-CM | POA: Diagnosis not present

## 2017-07-07 DIAGNOSIS — M5137 Other intervertebral disc degeneration, lumbosacral region: Secondary | ICD-10-CM | POA: Diagnosis not present

## 2017-07-07 DIAGNOSIS — M9903 Segmental and somatic dysfunction of lumbar region: Secondary | ICD-10-CM | POA: Diagnosis not present

## 2017-07-08 DIAGNOSIS — R1310 Dysphagia, unspecified: Secondary | ICD-10-CM | POA: Diagnosis not present

## 2017-07-08 DIAGNOSIS — K449 Diaphragmatic hernia without obstruction or gangrene: Secondary | ICD-10-CM | POA: Diagnosis not present

## 2017-07-08 DIAGNOSIS — I10 Essential (primary) hypertension: Secondary | ICD-10-CM | POA: Diagnosis not present

## 2017-07-08 DIAGNOSIS — K210 Gastro-esophageal reflux disease with esophagitis: Secondary | ICD-10-CM | POA: Diagnosis not present

## 2017-07-08 DIAGNOSIS — Z006 Encounter for examination for normal comparison and control in clinical research program: Secondary | ICD-10-CM | POA: Diagnosis not present

## 2017-07-08 DIAGNOSIS — R030 Elevated blood-pressure reading, without diagnosis of hypertension: Secondary | ICD-10-CM | POA: Diagnosis not present

## 2017-08-05 DIAGNOSIS — K521 Toxic gastroenteritis and colitis: Secondary | ICD-10-CM | POA: Diagnosis not present

## 2017-08-05 DIAGNOSIS — Z006 Encounter for examination for normal comparison and control in clinical research program: Secondary | ICD-10-CM | POA: Diagnosis not present

## 2017-09-02 DIAGNOSIS — K521 Toxic gastroenteritis and colitis: Secondary | ICD-10-CM | POA: Diagnosis not present

## 2017-09-02 DIAGNOSIS — Z006 Encounter for examination for normal comparison and control in clinical research program: Secondary | ICD-10-CM | POA: Diagnosis not present

## 2017-09-21 DIAGNOSIS — K521 Toxic gastroenteritis and colitis: Secondary | ICD-10-CM | POA: Diagnosis not present

## 2017-09-21 DIAGNOSIS — E119 Type 2 diabetes mellitus without complications: Secondary | ICD-10-CM | POA: Diagnosis not present

## 2017-09-21 DIAGNOSIS — E039 Hypothyroidism, unspecified: Secondary | ICD-10-CM | POA: Diagnosis not present

## 2017-09-30 DIAGNOSIS — Z006 Encounter for examination for normal comparison and control in clinical research program: Secondary | ICD-10-CM | POA: Diagnosis not present

## 2017-09-30 DIAGNOSIS — R030 Elevated blood-pressure reading, without diagnosis of hypertension: Secondary | ICD-10-CM | POA: Diagnosis not present

## 2017-09-30 DIAGNOSIS — C569 Malignant neoplasm of unspecified ovary: Secondary | ICD-10-CM | POA: Diagnosis not present

## 2017-10-28 DIAGNOSIS — Z006 Encounter for examination for normal comparison and control in clinical research program: Secondary | ICD-10-CM | POA: Diagnosis not present

## 2017-10-28 DIAGNOSIS — C569 Malignant neoplasm of unspecified ovary: Secondary | ICD-10-CM | POA: Diagnosis not present

## 2017-10-28 DIAGNOSIS — R030 Elevated blood-pressure reading, without diagnosis of hypertension: Secondary | ICD-10-CM | POA: Diagnosis not present

## 2017-11-25 DIAGNOSIS — E119 Type 2 diabetes mellitus without complications: Secondary | ICD-10-CM | POA: Diagnosis not present

## 2017-11-25 DIAGNOSIS — Z006 Encounter for examination for normal comparison and control in clinical research program: Secondary | ICD-10-CM | POA: Diagnosis not present

## 2017-11-25 DIAGNOSIS — C569 Malignant neoplasm of unspecified ovary: Secondary | ICD-10-CM | POA: Diagnosis not present

## 2017-12-09 DIAGNOSIS — C569 Malignant neoplasm of unspecified ovary: Secondary | ICD-10-CM | POA: Diagnosis not present

## 2017-12-09 DIAGNOSIS — Z006 Encounter for examination for normal comparison and control in clinical research program: Secondary | ICD-10-CM | POA: Diagnosis not present

## 2017-12-09 DIAGNOSIS — R3121 Asymptomatic microscopic hematuria: Secondary | ICD-10-CM | POA: Diagnosis not present

## 2017-12-28 DIAGNOSIS — I10 Essential (primary) hypertension: Secondary | ICD-10-CM | POA: Diagnosis not present

## 2017-12-28 DIAGNOSIS — C569 Malignant neoplasm of unspecified ovary: Secondary | ICD-10-CM | POA: Diagnosis not present

## 2017-12-28 DIAGNOSIS — Z006 Encounter for examination for normal comparison and control in clinical research program: Secondary | ICD-10-CM | POA: Diagnosis not present

## 2018-01-27 DIAGNOSIS — Z006 Encounter for examination for normal comparison and control in clinical research program: Secondary | ICD-10-CM | POA: Diagnosis not present

## 2018-01-27 DIAGNOSIS — C569 Malignant neoplasm of unspecified ovary: Secondary | ICD-10-CM | POA: Diagnosis not present

## 2018-01-27 DIAGNOSIS — R030 Elevated blood-pressure reading, without diagnosis of hypertension: Secondary | ICD-10-CM | POA: Diagnosis not present

## 2018-02-04 DIAGNOSIS — H11442 Conjunctival cysts, left eye: Secondary | ICD-10-CM | POA: Diagnosis not present
